# Patient Record
Sex: Female | Born: 1950
Health system: Southern US, Community
[De-identification: ages and names within clinical notes are randomized; demographics above are authoritative.]

## PROBLEM LIST (undated history)

## (undated) DIAGNOSIS — M810 Age-related osteoporosis without current pathological fracture: Secondary | ICD-10-CM

## (undated) DIAGNOSIS — M1990 Unspecified osteoarthritis, unspecified site: Secondary | ICD-10-CM

## (undated) DIAGNOSIS — G119 Hereditary ataxia, unspecified: Secondary | ICD-10-CM

## (undated) DIAGNOSIS — T7840XA Allergy, unspecified, initial encounter: Secondary | ICD-10-CM

## (undated) HISTORY — DX: Unspecified osteoarthritis, unspecified site: M19.90

## (undated) HISTORY — DX: Hereditary ataxia, unspecified: G11.9

## (undated) HISTORY — DX: Age-related osteoporosis without current pathological fracture: M81.0

## (undated) HISTORY — PX: ABDOMINAL HYSTERECTOMY: SHX81

## (undated) HISTORY — DX: Allergy, unspecified, initial encounter: T78.40XA

---

## 2006-03-05 ENCOUNTER — Encounter (INDEPENDENT_AMBULATORY_CARE_PROVIDER_SITE_OTHER): Payer: Self-pay | Admitting: *Deleted

## 2006-03-05 ENCOUNTER — Ambulatory Visit (HOSPITAL_COMMUNITY): Admission: RE | Admit: 2006-03-05 | Discharge: 2006-03-05 | Payer: Self-pay | Admitting: Gastroenterology

## 2010-09-27 ENCOUNTER — Encounter: Payer: Self-pay | Admitting: Sports Medicine

## 2010-09-29 ENCOUNTER — Ambulatory Visit
Admission: RE | Admit: 2010-09-29 | Discharge: 2010-09-29 | Payer: Self-pay | Source: Home / Self Care | Attending: Sports Medicine | Admitting: Sports Medicine

## 2010-09-29 DIAGNOSIS — R269 Unspecified abnormalities of gait and mobility: Secondary | ICD-10-CM | POA: Insufficient documentation

## 2010-09-29 DIAGNOSIS — M79609 Pain in unspecified limb: Secondary | ICD-10-CM | POA: Insufficient documentation

## 2010-10-20 NOTE — Assessment & Plan Note (Signed)
Summary: NP,CHRONIC FOOT PAIN,[facility]   Vital Signs:  Patient profile:   60 year old female Height:      63 inches Weight:      160 pounds BMI:     28.45 Pulse rate:   89 / minute BP sitting:   129 / 81  (left arm)  Vitals Entered By: Rochele Pages RN (September 29, 2010 8:54 AM) CC: bilat foot pain, L arch, R metatarsals   CC:  bilat foot pain, L arch, and R metatarsals.  History of Present Illness: Pt presents to clinic for evaluation of bilateral foot pain x 6 months.  L foot pain is in arch, and R foot pain is across metatarsals.  No pain in heels.  Pain is intermittent aching, sometimes with walking, and other times at rest.  Worsens thru the day.  She has taken ibuprofen 200mg  prn for pain, which is slightly helpful - does not use every day.  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   Denies any specific injury or trauma.  No change in activity.  No change in footwear, but was wearing flat shoes often.  Wears crocs around the house which are helpful.  Denies numbness/tingling.  Denies swelling.  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  Eyes:  PERRL Lungs:  normal respiratory effort.   Msk:  FEET: - L foot: good active and passive ROM, mild TTP over medial navicular, hyper-pigmentation over dorsum of toes, great toe overlaps 2nd toe, morton's callus, pes planus.  pronation with standing and quick pronation with walking - R foot: good active and passive ROM, mild TTP over 2nd, 3rd, 4th MTP joints.  Hyper-pigmentation over dorsum of toes, 2nd toe overlaps great toe, morton's callus, pes planus.  TTP inter-digital space between 2nd/3rd & 3rd/4th toes.  pronation with standing  ANKLES: FROM bilaterally without pain, weakness, tenderness, or instability.  GAIT: no leg length difference.  Mlld genu valgum.  significant wobble of ankles b/l with walking - noted to have rapid pronation with walking.    HIPS: mild weakness of abductors & adductors b/l.  FROM without pain. Pulses:  +2/4 DP & PT b/l Neurologic:  sensation intact to light touch.     Impression & Recommendations:  Problem # 1:  FOOT PAIN, BILATERAL (ICD-729.5)  - Chronic foot pain over left longitudinal arch and in right metatarsals due to breakdown of arches and insufficient support in shoes. - Gave sports insoles with added small scaphoid pads & metatarsal pads.  Comfortable to patient in office and gait more neutral with decreased rapid pronation. - Recommended wearing supportive shoes with good arch support & cushioning. - May take ibuprofen  600-800mg  three times a day with food as needed pain  - Instructed patient to return to clinic in 4-6 weeks if no improvement or as needed.  Orders: Sports Insoles [phone]) Metatarsal Pads [phone])  Problem # 2:  ABNORMALITY OF GAIT (ICD-781.2) - b/l pes planus with increased pronation & rapid pronation with walking - fitted with sports insoles as stated above.  Gait more neutral, rapid pronation still present, but decreased.  Orders: Sports  Insoles [phone]) Metatarsal Pads [phone])   Orders Added: 1)  New Patient Level II [99202] 2)  Sports Insoles [L3510] 3)  Metatarsal Pads [K4401]

## 2010-10-20 NOTE — Consult Note (Signed)
Summary: [facility]  [facility]   Imported By: Marily Memos 09/28/2010 09:34:14  _____________________________________________________________________  External Attachment:    Type:   Image     Comment:   External Document

## 2011-02-03 NOTE — Op Note (Signed)
NAMECANDE, Priscilla Washington               ACCOUNT NO.:  [id number]   MEDICAL RECORD NO.:  [id number]          PATIENT TYPE:  AMB   LOCATION:  ENDO                         FACILITY:  [facility]   PHYSICIAN:  Anselmo Rod, M.D.  DATE OF BIRTH:  [birth year]   DATE OF PROCEDURE:  03/07/2006  DATE OF DISCHARGE:  03/05/2006                                 OPERATIVE REPORT   PROCEDURE PERFORMED:  Colonoscopy with snare polypectomy x2 and cold  biopsies x4.   ENDOSCOPIST:  Anselmo Rod, M.D.   INSTRUMENT USED:  Olympus video colonoscope.   INDICATIONS FOR PROCEDURE:  A 60 year old female undergoing screening  colonoscopy to rule out colonic polyps, masses, etc.  There is a family  history of colon cancer in a first degree relative (mother).   PREPROCEDURE PREPARATION:  Informed consent was procured from the patient.  The patient was fasted for four hours prior to the procedure and prepped  with OsmoPrep pills the night of and the morning of the procedure.  The  risks and benefits of the procedure including a 10% miss rate for cancer or  polyps was discussed with the patient as well.   PREPROCEDURE PHYSICAL:  The patient had stable vital signs.  Neck supple.  Chest clear to auscultation.  S1 and S2 regular.  Abdomen soft with normal  bowel sounds.   DESCRIPTION OF PROCEDURE:  The patient was placed in left lateral decubitus  position and sedated with 75 mcg of fentanyl and 5 mg of Versed in slow  incremental doses.  Once the patient was adequately sedated and maintained  on low flow oxygen and continuous cardiac monitoring, the Olympus video  colonoscope was advanced from the rectum to the cecum.  The appendicular  orifice and ileocecal valve were clearly visualized and photographed.  Two  polyps were snared from the proximal right colon and distal right colon  respectively.  These were removed by a hot snare.  Four small sessile polyps  were biopsied from the rectosigmoid colon.  The rest of  the exam was  unremarkable.  There was no evidence of diverticulosis.  Retroflexion in the  rectum revealed no abnormality.  The patient tolerated the procedure well  without complication.   IMPRESSION:  1.Two polyps removed by hot snare, one from the proximal right  colon, one from the distal right colon.  2.Four small sessile polyps biopsied from rectosigmoid colon.  3.No other masses or polyps seen.   RECOMMENDATIONS:  1.Await pathology results.  2.Avoid all nonsteroidals including aspirin for the next two weeks.  3.Repeat colonoscopy depending on pathology results.  4.Outpatient followup as need arises in the future.      Anselmo Rod, M.D.  Electronically Signed     JNM/MEDQ  D:  03/07/2006  T:  03/07/2006  Job:  [id number]   cc:   Arlyce Harman  Fax: [phone]

## 2013-02-24 ENCOUNTER — Encounter: Payer: Self-pay | Admitting: Neurology

## 2013-02-25 ENCOUNTER — Ambulatory Visit (INDEPENDENT_AMBULATORY_CARE_PROVIDER_SITE_OTHER): Payer: BC Managed Care – PPO | Admitting: Neurology

## 2013-02-25 ENCOUNTER — Encounter: Payer: Self-pay | Admitting: Neurology

## 2013-02-25 VITALS — BP 141/83 | HR 94 | Ht 62.75 in | Wt 150.0 lb

## 2013-02-25 DIAGNOSIS — R269 Unspecified abnormalities of gait and mobility: Secondary | ICD-10-CM

## 2013-02-25 DIAGNOSIS — M79609 Pain in unspecified limb: Secondary | ICD-10-CM

## 2013-02-25 NOTE — Progress Notes (Signed)
HPI: Priscilla Washington is a 62 years old right-handed Caucasian female, referred by her primary care physician Dr. Loyal Jacobson, orthopedic surgeon and Hadley Pen, accompanied by her daughter for evaluation of progressive gait difficulty   She has PMHx of HTN, strong family history of gait difficulty. Her brother at 4, had gait difficult, started at age 48, was diagnosed with cerebellar degenerative disease, progressive quickly to wheel chair bound now, reported abnormal MRI brain, but no genetic testings, Her nephew at age 54, from old sister had similar gait difficulty, use cane.  The mother of that nephew, her elderly sister, died at age 64, was diagnosed with parkinson's, presented with difficulty walking shaking. All mother died of colon  cancerat age 47, her father died of heart attack at age 60, she has 2 children at age 50, and 42, there was no gait difficulty  She is of Chartered loss adjuster, began to complains of mild gait difficulty for 3 years since 2011,, rapid progression during the first year, now level off over the past 2 years, she complains of unsteady gait, especially unfamiliar environment, has difficulty keeping her balance,  She denies visual loss, no bilateral lower extremity weakness, no incontinence, no low back pain, no significant upper extremity symptoms, no chewing swallowing difficulty, no dysarthria  Review of Systems  Out of a complete 14 system review, the patient complains of only the following symptoms, and all other reviewed systems are negative.   Constitutional:   N/A Cardiovascular:  N/A Ear/Nose/Throat:  N/A Skin: N/A Eyes: N/A Respiratory: N/A Gastroitestinal: N/A    Hematology/Lymphatic:  N/A Endocrine:  N/A Musculoskeletal: aching muscles, balance issue Allergy/Immunology: N/A Neurological: N/A Psychiatric:    N/A  PHYSICAL EXAMINATOINS:  Generalized: In no acute distress  Neck: Supple, no carotid bruits   Cardiac: Regular rate rhythm  Pulmonary: Clear to  auscultation bilaterally  Musculoskeletal: No deformity  Neurological examination  Mentation: Alert oriented to time, place, history taking, and causual conversation  Cranial nerve II-XII: Pupils were equal round reactive to light extraocular movements were full, visual field were full on confrontational test. facial sensation and strength were normal. Smooth pursuit was broken into small catch up saccade.  Hearing was intact to finger rubbing bilaterally. Uvula tongue midline.  head turning and shoulder shrug and were normal and symmetric.Tongue protrusion into cheek strength was normal.  Motor: normal tone, bulk and strength.  Sensory: Intact to fine touch, pinprick, preserved vibratory sensation, and proprioception at toes.  Coordination: Normal finger to nose, heel-to-shin dysmetria  Bilaterally, there was  mild  truncal ataxia  Gait: wide based ataxic gait, could not do tandem walking. Mild trunk ataxia  Romberg signs: positive.  Deep tendon reflexes: Brachioradialis 2/2, biceps 2/2, triceps 2/2, patellar 2/2, Achilles 2/2, plantar responses were flexor bilaterally.  Assessment and plan:  62 years old right-handed Caucasian female, with past medical history of gradual onset gait difficulty, family history of similar disorder,  on examination, she has limb ataxia, ataxic gait,   1. Consistent with autosome dominant spinocerebellar ataxia. 2. MRI brain, EMG/NCS. 3. Physical therapy. 4. Lab evaluation. 5. RTC in 1-2 months

## 2013-02-26 LAB — COMPREHENSIVE METABOLIC PANEL
AST: 27 IU/L (ref 0–40)
Alkaline Phosphatase: 60 IU/L (ref 39–117)
BUN/Creatinine Ratio: 20 (ref 11–26)
CO2: 30 mmol/L — ABNORMAL HIGH (ref 19–28)
Creatinine, Ser: 0.86 mg/dL (ref 0.57–1.00)
Globulin, Total: 2.3 g/dL (ref 1.5–4.5)
Potassium: 4.3 mmol/L (ref 3.5–5.2)
Sodium: 143 mmol/L (ref 134–144)

## 2013-02-26 LAB — CBC
Hemoglobin: 12.8 g/dL (ref 11.1–15.9)
MCH: 27.8 pg (ref 26.6–33.0)
MCV: 85 fL (ref 79–97)
RBC: 4.61 x10E6/uL (ref 3.77–5.28)

## 2013-02-26 LAB — RPR: RPR: NONREACTIVE

## 2013-02-26 LAB — ANA: Anti Nuclear Antibody(ANA): NEGATIVE

## 2013-02-28 ENCOUNTER — Telehealth: Payer: Self-pay | Admitting: Neurology

## 2013-03-03 NOTE — Telephone Encounter (Signed)
Mailing lab results to pt per pt request.

## 2013-03-06 ENCOUNTER — Ambulatory Visit (INDEPENDENT_AMBULATORY_CARE_PROVIDER_SITE_OTHER): Payer: BC Managed Care – PPO

## 2013-03-06 DIAGNOSIS — M79609 Pain in unspecified limb: Secondary | ICD-10-CM

## 2013-03-06 DIAGNOSIS — R269 Unspecified abnormalities of gait and mobility: Secondary | ICD-10-CM

## 2013-03-07 NOTE — Progress Notes (Signed)
Quick Note:  Left a vm on patient's mobile phone and relayed the results of their MRI. The patient was also reminded of any future appointments. Contact information was left if pt has any questions. ______

## 2013-03-07 NOTE — Progress Notes (Signed)
Quick Note:  please call patient, age related changes, no acute lesions, ______

## 2013-04-03 ENCOUNTER — Encounter (INDEPENDENT_AMBULATORY_CARE_PROVIDER_SITE_OTHER): Payer: BC Managed Care – PPO

## 2013-04-03 ENCOUNTER — Ambulatory Visit (INDEPENDENT_AMBULATORY_CARE_PROVIDER_SITE_OTHER): Payer: BC Managed Care – PPO | Admitting: Neurology

## 2013-04-03 DIAGNOSIS — R269 Unspecified abnormalities of gait and mobility: Secondary | ICD-10-CM

## 2013-04-03 DIAGNOSIS — M79609 Pain in unspecified limb: Secondary | ICD-10-CM

## 2013-04-03 DIAGNOSIS — Z0289 Encounter for other administrative examinations: Secondary | ICD-10-CM

## 2013-04-03 NOTE — Procedures (Signed)
History of present illness:  62 years old [place] American female, with gradual onset gait difficulty, strong family history of similar disease,   Nerve conduction study:  Bilateral peroneal sensory responses are absent. Bilateral peroneal to EDB, and tibial motor responses were absent. Bilateral tibial H. reflexes were present and symmetric   Electromyography:  Selected needle examination was performed at right lower extremity muscles, and right lumbosacral paraspinal muscles .  Needle examination of right tibialis anterior, tibialis posterior, medial gastrocnemius, peroneal longus, vastus lateralis was normal . There was no spontaneous activity at right lumbosacral paraspinal muscles, right L4, L5, S1   In conclusion:   This is a normal study. There is no electrodiagnostic evidence of large fiber peripheral neuropathy, or right lumbosacral radiculopathy .

## 2013-04-08 ENCOUNTER — Telehealth: Payer: Self-pay | Admitting: Neurology

## 2013-04-08 NOTE — Telephone Encounter (Signed)
Patient left message wanting to know if the doctor wants her to continue with or to have PT, message was not

## 2013-04-14 NOTE — Telephone Encounter (Signed)
I spoke to patient and told her that her referral was sent for PT to [facility].  She was advised to call them and check on an appointment.  She agreed.

## 2017-09-27 DIAGNOSIS — Z1211 Encounter for screening for malignant neoplasm of colon: Secondary | ICD-10-CM | POA: Diagnosis not present

## 2017-09-27 DIAGNOSIS — R635 Abnormal weight gain: Secondary | ICD-10-CM | POA: Diagnosis not present

## 2017-09-27 DIAGNOSIS — Z800 Family history of malignant neoplasm of digestive organs: Secondary | ICD-10-CM | POA: Diagnosis not present

## 2017-10-24 DIAGNOSIS — Z1231 Encounter for screening mammogram for malignant neoplasm of breast: Secondary | ICD-10-CM | POA: Diagnosis not present

## 2017-10-24 DIAGNOSIS — Z1212 Encounter for screening for malignant neoplasm of rectum: Secondary | ICD-10-CM | POA: Diagnosis not present

## 2017-10-24 DIAGNOSIS — Z1289 Encounter for screening for malignant neoplasm of other sites: Secondary | ICD-10-CM | POA: Diagnosis not present

## 2017-11-16 DIAGNOSIS — K635 Polyp of colon: Secondary | ICD-10-CM | POA: Diagnosis not present

## 2017-11-16 DIAGNOSIS — Z800 Family history of malignant neoplasm of digestive organs: Secondary | ICD-10-CM | POA: Diagnosis not present

## 2017-11-16 DIAGNOSIS — D127 Benign neoplasm of rectosigmoid junction: Secondary | ICD-10-CM | POA: Diagnosis not present

## 2017-11-16 DIAGNOSIS — Z1211 Encounter for screening for malignant neoplasm of colon: Secondary | ICD-10-CM | POA: Diagnosis not present

## 2018-10-30 DIAGNOSIS — Z1231 Encounter for screening mammogram for malignant neoplasm of breast: Secondary | ICD-10-CM | POA: Diagnosis not present

## 2018-10-30 DIAGNOSIS — G250 Essential tremor: Secondary | ICD-10-CM | POA: Diagnosis not present

## 2018-10-30 DIAGNOSIS — Z0000 Encounter for general adult medical examination without abnormal findings: Secondary | ICD-10-CM | POA: Diagnosis not present

## 2018-10-30 DIAGNOSIS — R2681 Unsteadiness on feet: Secondary | ICD-10-CM | POA: Diagnosis not present

## 2018-10-30 DIAGNOSIS — H6122 Impacted cerumen, left ear: Secondary | ICD-10-CM | POA: Diagnosis not present

## 2018-10-30 DIAGNOSIS — E041 Nontoxic single thyroid nodule: Secondary | ICD-10-CM | POA: Diagnosis not present

## 2018-11-05 DIAGNOSIS — G119 Hereditary ataxia, unspecified: Secondary | ICD-10-CM | POA: Insufficient documentation

## 2018-11-12 DIAGNOSIS — Z1231 Encounter for screening mammogram for malignant neoplasm of breast: Secondary | ICD-10-CM | POA: Diagnosis not present

## 2019-11-10 ENCOUNTER — Ambulatory Visit: Payer: Medicare HMO | Attending: Family

## 2019-11-10 DIAGNOSIS — Z23 Encounter for immunization: Secondary | ICD-10-CM | POA: Insufficient documentation

## 2019-11-10 NOTE — Progress Notes (Signed)
   Covid-19 Vaccination Clinic  Name:  Priscilla Washington    MRN: [id number] DOB: Jul 10, 1951  11/10/2019  [name] was observed post Covid-19 immunization for 15 minutes without incidence. She was provided with Vaccine Information Sheet and instruction to access the V-Safe system.   [name] was instructed to call 911 with any severe reactions post vaccine: Marland Kitchen Difficulty breathing  . Swelling of your face and throat  . A fast heartbeat  . A bad rash all over your body  . Dizziness and weakness    Immunizations Administered    Name Date Dose VIS Date Route   Moderna COVID-19 Vaccine 11/10/2019  2:08 PM 0.5 mL 08/19/2019 Intramuscular   Manufacturer: Moderna   Lot: [id number]   [id number]

## 2019-12-16 ENCOUNTER — Ambulatory Visit: Payer: Medicare HMO | Attending: Family

## 2019-12-16 DIAGNOSIS — Z23 Encounter for immunization: Secondary | ICD-10-CM

## 2019-12-16 NOTE — Progress Notes (Signed)
   Covid-19 Vaccination Clinic  Name:  Robynn Dubach    MRN: [id number] DOB: Feb 11, 1951  12/16/2019  Ms. Kluth was observed post Covid-19 immunization for 15 minutes without incident. She was provided with Vaccine Information Sheet and instruction to access the V-Safe system.   Ms. Tellefsen was instructed to call 911 with any severe reactions post vaccine: Marland Kitchen Difficulty breathing  . Swelling of face and throat  . A fast heartbeat  . A bad rash all over body  . Dizziness and weakness   Immunizations Administered    Name Date Dose VIS Date Route   Moderna COVID-19 Vaccine 12/16/2019  1:36 PM 0.5 mL 08/19/2019 Intramuscular   Manufacturer: Moderna   Lot: [id number]   [place]indaDW:[id number]

## 2020-02-13 LAB — CBC AND DIFFERENTIAL
HCT: 41 (ref 36–46)
Hemoglobin: 12.6 (ref 12.0–16.0)
Neutrophils Absolute: 2
Platelets: 280 (ref 150–399)
WBC: 4

## 2020-02-13 LAB — BASIC METABOLIC PANEL
BUN: 20 (ref 4–21)
CO2: 30 — AB (ref 13–22)
Chloride: 106 (ref 99–108)
Creatinine: 0.7 (ref 0.5–1.1)
Glucose: 81
Potassium: 4.3 (ref 3.4–5.3)
Sodium: 141 (ref 137–147)

## 2020-02-13 LAB — HEPATIC FUNCTION PANEL
ALT: 33 (ref 7–35)
AST: 28 (ref 13–35)
Alkaline Phosphatase: 61 (ref 25–125)
Bilirubin, Total: 0.4

## 2020-02-13 LAB — LIPID PANEL
Cholesterol: 252 — AB (ref 0–200)
HDL: 93 — AB (ref 35–70)
LDL Cholesterol: 151
Triglycerides: 42 (ref 40–160)

## 2020-02-13 LAB — CBC: RBC: 4.6 (ref 3.87–5.11)

## 2020-02-13 LAB — COMPREHENSIVE METABOLIC PANEL
Albumin: 3.9 (ref 3.5–5.0)
Calcium: 9.5 (ref 8.7–10.7)

## 2020-02-13 LAB — TSH: TSH: 0.88 (ref 0.41–5.90)

## 2020-02-13 LAB — VITAMIN D 25 HYDROXY (VIT D DEFICIENCY, FRACTURES): Vit D, 25-Hydroxy: 53

## 2020-04-14 ENCOUNTER — Ambulatory Visit: Payer: Medicare HMO | Admitting: Physician Assistant

## 2020-04-19 ENCOUNTER — Encounter: Payer: Self-pay | Admitting: Physician Assistant

## 2020-04-19 ENCOUNTER — Other Ambulatory Visit: Payer: Self-pay

## 2020-04-19 ENCOUNTER — Ambulatory Visit (INDEPENDENT_AMBULATORY_CARE_PROVIDER_SITE_OTHER): Payer: Medicare HMO | Admitting: Physician Assistant

## 2020-04-19 VITALS — BP 140/80 | HR 67 | Temp 98.2°F | Resp 16 | Ht 62.5 in | Wt 136.0 lb

## 2020-04-19 DIAGNOSIS — G119 Hereditary ataxia, unspecified: Secondary | ICD-10-CM

## 2020-04-19 DIAGNOSIS — R2681 Unsteadiness on feet: Secondary | ICD-10-CM

## 2020-04-19 NOTE — Patient Instructions (Addendum)
   I will work on getting you a new rollator walker.  Start a B complex multivitamin and Citracal-D supplement.   We will get records to see about recent diagnosis of osteoporosis as this should be treated. I will get records of recent lab results as well. I will call once I have reviewed.   Follow-up with GYN as scheduled.  You will be scheduled for an appointment with Sports Medicine and Neurology.   It was very nice meeting you today. Welcome to [facility]!

## 2020-04-19 NOTE — Progress Notes (Signed)
Patient presents to clinic today with her daughter to establish care. Previously followed by PCP in [place], [place]. Has also been followed by GYN, Dr. Enid Skeens. Notes Mammogram up-to-date. Also noted Bone Density up-to-date. Records requested.   Their biggest concern today is worsening issue with balance and gait. This has been an issue over the past few years, causing patient to require assistance with mobility. Initially needed a cane occasionally, then frequently, then a regular walker, now a rollator walker. Thankfully no recent falls. Patient states she cannot maintain her balance easily. Denies dizziness or lightheadedness. They have never seen a Neurologist. Daughter notes that patient's older sister, Lib, has Parkinson's disease. All of her brothers have been diagnosed with cerebellar ataxia. Notes patient had MRI about 10 years ago and was told things looked normal at that time. They are not noting any major issues with memory or changes in mood. Of note patient is in need of new rollator walker as hers is about 48+ years old and the brakes are not working well.    History reviewed. No pertinent past medical history.  Past Surgical History:  Procedure Laterality Date   ABDOMINAL HYSTERECTOMY Bilateral 1999,2000    Current Outpatient Medications on File Prior to Visit  Medication Sig Dispense Refill   AMOXICILLIN PO Take by mouth as directed.     aspirin 81 MG tablet Take 81 mg by mouth daily.     CALCIUM ASCORBATE PO Take by mouth daily.     diazepam (VALIUM) 2 MG tablet Take 2 mg by mouth 3 (three) times daily.     fluticasone (FLONASE) 50 MCG/ACT nasal spray Place 2 sprays into the nose daily.     ipratropium (ATROVENT) 0.03 % nasal spray Place 0.03 sprays into the nose daily.     Magnesium 250 MG TABS Take 250 mg by mouth daily.     simvastatin (ZOCOR) 20 MG tablet Take 20 mg by mouth daily.     No current facility-administered medications on file prior to visit.     Allergies  Allergen Reactions   Demerol [Meperidine]    Meperidine Hcl     Family History  Problem Relation Age of Onset   Colon cancer Mother    Heart attack Father    Colon cancer Other     Social History   Socioeconomic History   Marital status: Divorced    Spouse name: Not on file   Number of children: 2   Years of education: MBA   Highest education level: Not on file  Occupational History   Occupation: school teacher    Employer: [employer]  Tobacco Use   Smoking status: Never Smoker   Smokeless tobacco: Never Used  Substance and Sexual Activity   Alcohol use: No   Drug use: No   Sexual activity: Not on file  Other Topics Concern   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 She lives by herself. She denies smoking, drinking.   Social Determinants of Health   Financial Resource Strain:    Difficulty of Paying Living Expenses:   Food Insecurity:    Worried About Charity fundraiser in the Last Year:    Arboriculturist in the Last Year:   Transportation Needs:    Film/video editor (Medical):    Lack of Transportation (Non-Medical):   Physical Activity:    Days of  Exercise per Week:    Minutes of Exercise per Session:   Stress:    Feeling of Stress :   Social Connections:    Frequency of Communication with Friends and Family:    Frequency of Social Gatherings with Friends and Family:    Attends Religious Services:    Active Member of Clubs or Organizations:    Attends Music therapist:    Marital Status:   Intimate Partner Violence:    Fear of Current or Ex-Partner:    Emotionally Abused:    Physically Abused:    Sexually Abused:    ROS Pertinent ROS are listed in the HPI.   Ht 5' 2.5" (1.588 m)    Wt 136 lb (61.7 kg)    BMI 24.48 kg/m   Physical Exam Vitals reviewed.   Constitutional:      Appearance: Normal appearance.  HENT:     Head: Normocephalic and atraumatic.  Eyes:     Conjunctiva/sclera: Conjunctivae normal.     Pupils: Pupils are equal, round, and reactive to light.  Cardiovascular:     Rate and Rhythm: Normal rate and regular rhythm.     Pulses: Normal pulses.     Heart sounds: Normal heart sounds.  Pulmonary:     Effort: Pulmonary effort is normal.     Breath sounds: Normal breath sounds.  Musculoskeletal:     Cervical back: Normal range of motion and neck supple.  Neurological:     General: No focal deficit present.     Mental Status: She is alert and oriented to person, place, and time. Mental status is at baseline.     Cranial Nerves: No cranial nerve deficit.     Sensory: No sensory deficit.     Motor: No weakness.     Gait: Gait abnormal.     Comments: Patient with difficulty on cerebellar testing. Tandem walking not performed due to level of gait instability.  Psychiatric:        Mood and Affect: Mood normal.    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 No cranial nerve deficit. Continue use of rollator walker. New Rx sent in for this. Referral to Neurology placed for further evaluation and management.  - Ambulatory referral to Neurology  2. Gait instability Would like her to have full gait assessment to make sure joint issue and bone length discrepancies are not contributing.  - Ambulatory referral to Sports Medicine  This visit occurred during the SARS-CoV-2 public health emergency.  Safety protocols were in place, including screening questions prior to the visit, additional usage of staff PPE, and extensive cleaning of exam room while observing appropriate contact time as indicated for disinfecting solutions.     Leeanne Rio, PA-C

## 2020-04-21 ENCOUNTER — Encounter: Payer: Self-pay | Admitting: Physician Assistant

## 2020-04-21 ENCOUNTER — Encounter: Payer: Self-pay | Admitting: Neurology

## 2020-04-27 ENCOUNTER — Ambulatory Visit: Payer: Medicare HMO | Admitting: Family Medicine

## 2020-04-27 ENCOUNTER — Encounter: Payer: Self-pay | Admitting: Family Medicine

## 2020-04-27 ENCOUNTER — Other Ambulatory Visit: Payer: Self-pay

## 2020-04-27 DIAGNOSIS — G119 Hereditary ataxia, unspecified: Secondary | ICD-10-CM

## 2020-04-27 DIAGNOSIS — M2170 Unequal limb length (acquired), unspecified site: Secondary | ICD-10-CM

## 2020-04-27 DIAGNOSIS — R269 Unspecified abnormalities of gait and mobility: Secondary | ICD-10-CM | POA: Diagnosis not present

## 2020-04-27 DIAGNOSIS — M25552 Pain in left hip: Secondary | ICD-10-CM

## 2020-04-27 NOTE — Progress Notes (Signed)
Subjective:    CC: Gait instability and balance issues  I, Wendy Poet, LAT, ATC, am serving as scribe for Dr. Lynne Leader.  HPI: Pt is a 69 y/o female presenting w/ c/o worsening gait and balance.  She has transitioned from a cane to a standard walker and most recently to a rollator walker.  She has a family hx of Parkinson's and cerebellar ataxia and has been referred to neurology.    Diagnostic testing: Brain MRI- 03/06/13  Pertinent review of Systems: No fevers or chills  Relevant historical information: Family history ataxia   Objective:    General: Well Developed, well nourished, and in no acute distress.   MSK: Leg length discrepancy left leg about 1 cm longer than right. Neuro: Significant ataxic gait using a walker to ambulate. Ataxia with finger-nose-finger and with heel-to-shin upper and lower extremities bilaterally.  Lab and Radiology Results [facility]   NEUROIMAGING REPORT    STUDY DATE: 03/06/13  PATIENT NAME: Priscilla Washington  DOB: 1950-10-23  MRN: [id number]   ORDERING CLINICIAN: Marcial Pacas, MD  CLINICAL HISTORY: 69 year old female with gait difficulty and  ataxia.   EXAM: MRI brain (without)  TECHNIQUE: MRI of the brain without contrast was obtained  utilizing 5 mm axial slices with T1, T2, T2 flair, T2 star  gradient echo and diffusion weighted views. T1 sagittal and T2  coronal views were obtained.  CONTRAST: no  IMAGING SITE: [facility] [address]   FINDINGS:  No abnormal lesions are seen on diffusion-weighted views to  suggest acute ischemia. The cortical sulci, fissures and cisterns  are normal in size and appearance. Lateral, third and fourth  ventricle are normal in size and appearance. No extra-axial fluid  collections are seen. No evidence of mass effect or midline  shift. Minimal periventricular and subcortical foci of  non-specific gliosis.   On sagittal views the posterior fossa, pituitary gland and  corpus  callosum are notable for mild cerebellar atrophy. No evidence of  intracranial hemorrhage on gradient-echo views. The orbits and  their contents, paranasal sinuses and calvarium are unremarkable.  Intracranial flow voids are present.    IMPRESSION:  Equivocal MRI brain (without) demonstrating:  1. Mild cerebellar atrophy.  2. Minimal periventricular and subcortical foci of non-specific  gliosis.     INTERPRETING PHYSICIAN:  Penni Bombard, MD  Certified in Neurology, Neurophysiology and Neuroimaging   [facility]  [address], [place]  [place], [place] [address]  [phone]     Impression and Recommendations:    Assessment and Plan: 69 y.o. female with ataxia.  Patient has pretty clear family history for what seems to be degenerative neurological disorder causing ataxia or motion disorder.  I think it is absolutely reasonable for her to have a new neurological evaluation and likely work-up.  This is scheduled for the near future.  From my perspective she does have a bit of a leg length discrepancy and I try to correct that with a heel lift in her right shoe.  Additionally I think she will benefit significantly from physical therapy is independently of her neurological issue will probably help some of the muscle imbalances and weaknesses that she has as a result of her leg length difference and her gait disorder.  Plan to refer to physical therapy to neuro rehab.  Anticipate neurology recommendations and recheck in 6 to 8 weeks.   Orders Placed This Encounter  Procedures  . Ambulatory referral to Physical  Therapy    Referral Priority:   Routine    Referral Type:   Physical Medicine    Referral Reason:   Specialty Services Required    Requested Specialty:   Physical Therapy   No orders of the defined types were placed in this encounter.   Discussed warning signs or symptoms. Please see discharge instructions. Patient expresses  understanding.   The above documentation has been reviewed and is accurate and complete Lynne Leader, M.D.

## 2020-04-27 NOTE — Patient Instructions (Signed)
Thank you for coming in today.  Plan for heel lift in the right shoe.  Plan for PT with neuro rehab.  Agree with Neurology referral.  Recheck in 6-8 weeks with me.

## 2020-04-27 NOTE — Progress Notes (Signed)
Assessment/Plan:    1.  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  F/u after above completed.  Subjective:   Priscilla Washington was seen today in the movement disorders clinic for neurologic consultation at the request of Delorse Limber.  The consultation is for the evaluation of ataxia. This patient is accompanied in the office by her daughter who supplements the history. Outside records that were made available to me were reviewed, including records from primary care as well as records from Dr. Krista Blue back in 2014.  She saw Dr. Krista Blue for the same.  Dr. Krista Blue did write in her records that the patient's history/exam was consistent with autosomal dominant spinocerebellar ataxia and that she was going to do a lab evaluation.  While I see a phone call stating that labs were normal, I do not see anything scanned in the find out exactly what was tested for.  Pt does not think that genetic testing was completed.  [facility] was called by [place] and they have never done labs for this patient.  Symptoms started with gait trouble in the year 2011 per medical records from Dr. Krista Blue.  She saw Dr. Krista Blue in 2014.  She denies falls but the balance has gotten worse with the course of time.  She started using walker intermittently in 2011 but over the course of time, she has needed to hold onto things.  Now, she uses the walker all of the time.     Specific Symptoms:  Tremor: Yes.  , if gets nervous - she will shake on the inside and daughter notices some head tremor Family hx of similar:  Yes.   , Older sister had Parkinson's disease but daughter wonders if misdx.    Brothers symptoms started at the age of 77 years old.  Apparently had had no genetic testing but had clinical dx of SCA.  He was wheelchair-bound but is deceased now.  Her older sisters son (her nephew) has gait trouble, but unknown diagnosis.  Has another nephew with similar gait trouble. Voice: patient states "fine" but daughter states occasionally slurred Loss of smell:  No. Loss of taste:  No. Urinary Incontinence:  No. Difficulty Swallowing:  No. Handwriting, micrographia: No. but it is shaky Cramps: pt denies initially but daughter states that her hands cramp more frequently than other Trouble with ADL's:  No.  Trouble buttoning clothing: No. Depression:  No. Memory changes:  No. N/V:  No. Lightheaded:  No.  Syncope: No. Diplopia:  Yes.   - "sometimes I have to close one eye to focus" Paresthesias:  Yes.  , feet, esp at night, better in the day   Patient's last MRI of the brain was in June, 2014 through [facility] neurology.  I am unable to see the films.  It states that there is mild cerebellar atrophy and small vessel disease.   ALLERGIES:   Allergies  Allergen Reactions  . Demerol [Meperidine]   . Meperidine Hcl     CURRENT MEDICATIONS:  Current Outpatient Medications  Medication Instructions  .  aspirin 81 mg, Oral, Daily  . calcium citrate-vitamin D (CITRACAL+D) 315-200 MG-UNIT tablet 1 tablet, Oral, Daily  . Multiple Vitamin (MULTIVITAMIN) tablet 1 tablet, Oral, Daily    Objective:   VITALS:   Vitals:   04/29/20 1335  BP: (!) 162/82  Pulse: 80  SpO2: 100%  Weight: 133 lb (60.3 kg)  Height: 5' 3.5" (1.613 m)    GEN:  The patient appears stated age and is in NAD. HEENT:  Normocephalic, atraumatic.  The mucous membranes are moist. The superficial temporal arteries are without ropiness or tenderness. CV:  RRR Lungs:  CTAB Neck/HEME:  There are no carotid bruits bilaterally.  Slight  head/neck tremor.  Head turned to the left.    Neurological examination:  Orientation: The patient is alert and oriented x3.  Cranial nerves: There is good facial symmetry. Extraocular muscles are intact with the exception of upgaze paresis. The visual fields are full to confrontational testing. The speech is fluent and clear with mild pseudobulbar character. Soft palate rises symmetrically and there is no tongue deviation. Hearing is intact to conversational tone. Sensation: Sensation is intact to light and pinprick throughout (facial, trunk, extremities). Vibration is intact at the bilateral big toe. There is no extinction with double simultaneous stimulation. There is no sensory dermatomal level identified. Motor: Strength is 5/5 in the bilateral upper and lower extremities.   Shoulder shrug is equal and symmetric.  There is no pronator drift. Deep tendon reflexes: Deep tendon reflexes are 2+-3/4 at the bilateral biceps, triceps, brachioradialis, patella and achilles. Plantar responses are downgoing bilaterally.  Movement examination: Tone: There is normal tone in the bilateral upper extremities.  The tone in the lower extremities is normal.  Abnormal movements: there is head tremor in the "no" direction as above Coordination:  There is no decremation with RAM's.  She is ataxic with H-S Gait and Station: The patient has difficulty arising out of a deep-seated chair without the use of the hands. The patient is ataxic and would fall without assist.  She does fairly well with the walker.   I have reviewed and interpreted the following labs independently   Chemistry      Component Value Date/Time   NA 141 02/13/2020 0000   K 4.3 02/13/2020 0000   CL 106 02/13/2020 0000   CO2 30 (A) 02/13/2020 0000   BUN 20 02/13/2020 0000   CREATININE 0.7 02/13/2020 0000   CREATININE 0.86 02/25/2013 1142   GLU 81 02/13/2020 0000      Component Value Date/Time   CALCIUM 9.5 02/13/2020 0000   ALKPHOS 61  02/13/2020 0000   AST 28 02/13/2020 0000   ALT 33 02/13/2020 0000   BILITOT 0.3 02/25/2013 1142      Lab Results  Component Value Date   TSH 0.88 02/13/2020   Lab Results  Component Value Date   WBC 4.0 02/13/2020   HGB 12.6 02/13/2020   HCT 41 02/13/2020   MCV 85 02/25/2013   PLT 280 02/13/2020   Lab Results  Component Value Date   VITAMINB12 1,652 (H) 02/25/2013     Total time spent on today's visit was 70 minutes, including both face-to-face time and nonface-to-face time.  Time included that spent on review of records (prior notes available to me/labs/imaging if pertinent), discussing treatment and goals, answering patient's questions and coordinating care.  Cc:  Brunetta Jeans, PA-C

## 2020-04-29 ENCOUNTER — Encounter: Payer: Self-pay | Admitting: Neurology

## 2020-04-29 ENCOUNTER — Ambulatory Visit: Payer: Medicare HMO | Admitting: Neurology

## 2020-04-29 ENCOUNTER — Other Ambulatory Visit: Payer: Self-pay

## 2020-04-29 VITALS — BP 162/82 | HR 80 | Ht 63.5 in | Wt 133.0 lb

## 2020-04-29 DIAGNOSIS — R270 Ataxia, unspecified: Secondary | ICD-10-CM

## 2020-04-29 NOTE — Addendum Note (Signed)
Addended by: Ulice Brilliant T on: 04/29/2020 02:40 PM   Modules accepted: Orders

## 2020-04-29 NOTE — Patient Instructions (Signed)
A referral to [facility] has been placed for your MRI someone will contact you directly to schedule your appt. They are located at [address] Please contact them directly by calling [phone] with any questions regarding your referral.  

## 2020-05-03 ENCOUNTER — Telehealth: Payer: Self-pay | Admitting: Physician Assistant

## 2020-05-03 NOTE — Telephone Encounter (Signed)
Spoke with Patrick Jupiter at [facility] who said they did not receive the faxed order for the Rollator on 04/23/20. I re-faxed today to the correct fax number. Advised patient daughter of status of rollator. If she has heard from them to let us know. She is agreeable.

## 2020-05-03 NOTE — Telephone Encounter (Signed)
Pt's daughter called in asking for an up date on the new rollator walker.  Please advise

## 2020-05-08 ENCOUNTER — Ambulatory Visit (HOSPITAL_BASED_OUTPATIENT_CLINIC_OR_DEPARTMENT_OTHER)
Admission: RE | Admit: 2020-05-08 | Discharge: 2020-05-08 | Disposition: A | Discharge status: Home / Self Care | Payer: Medicare HMO | Source: Ambulatory Visit | Attending: Neurology | Admitting: Neurology

## 2020-05-08 ENCOUNTER — Other Ambulatory Visit: Payer: Self-pay

## 2020-05-08 DIAGNOSIS — R270 Ataxia, unspecified: Secondary | ICD-10-CM | POA: Diagnosis present

## 2020-05-13 ENCOUNTER — Telehealth: Payer: Self-pay | Admitting: Neurology

## 2020-05-13 DIAGNOSIS — R270 Ataxia, unspecified: Secondary | ICD-10-CM

## 2020-05-13 NOTE — Telephone Encounter (Signed)
Patient called in and left a message. She stated she just got off the phone with someone about her MRI results and had another question.

## 2020-05-13 NOTE — Telephone Encounter (Signed)
Spoke with patient who requested I contact her daughter.

## 2020-05-13 NOTE — Telephone Encounter (Signed)
Patient called back in. She is returning a call from her MRI results.

## 2020-05-14 NOTE — Telephone Encounter (Signed)
Spoke with patients daughter and she wants to know what are the treatment options? Would the treatment be fixed with physical therapy help and if so can we do the referral for physical therapy? Advised daughter that I would speak with Dr Tat and get back with her. She voiced understanding.

## 2020-05-14 NOTE — Telephone Encounter (Signed)
Left message for patients daughter to contact the office.

## 2020-05-14 NOTE — Telephone Encounter (Signed)
This is somewhat of a difficult question to answer since they declined genetic testing and we don't have a definitive diagnosis.  But generally PT is to help symptoms not cure disease

## 2020-05-17 NOTE — Telephone Encounter (Signed)
Pt referral placed for patient to go to [place] home [facility].  Message sent to schedulers to schedule follow up virtually.

## 2020-05-17 NOTE — Telephone Encounter (Signed)
Spoke with patients daughter, gave her Dr Doristine Devoid recommendations. She voiced understanding. She is agreeable with physical therapy and wants a referral.   She would also like to schedule a follow up appt to ask a few questions. She is agreeable with a virtual appt.

## 2020-05-17 NOTE — Telephone Encounter (Signed)
You can go ahead and refer for PT and the front desk can schedule a f/u VV when available

## 2020-06-02 NOTE — Progress Notes (Signed)
  Pt scheduled for VV.  Upon linking only daughter present.  Medical assistant spoke with pt who said that she was unaware of appt.  pts daughter stated that pt was going to cx appt but daughter hoping to ask some questions.  Explained to daughter that I could not do visit without patient present.  She expressed understanding.

## 2020-06-03 ENCOUNTER — Telehealth (INDEPENDENT_AMBULATORY_CARE_PROVIDER_SITE_OTHER): Payer: Medicare HMO | Admitting: Neurology

## 2020-06-03 ENCOUNTER — Other Ambulatory Visit: Payer: Self-pay

## 2020-06-03 ENCOUNTER — Encounter: Payer: Self-pay | Admitting: Neurology

## 2020-06-22 ENCOUNTER — Ambulatory Visit: Payer: Medicare HMO | Admitting: Family Medicine

## 2020-06-22 NOTE — Progress Notes (Deleted)
   I, Wendy Poet, LAT, ATC, am serving as scribe for Dr. Lynne Leader.  Davis Vannatter is a 69 y.o. female who presents to [place] at [facility] today for f/u of gait and balance issues.  She was last seen by Dr. Georgina Snell on 04/27/20 and was referred to neuro rehab and was encouraged to see neurology per her prior referral.  Since her last visit, pt reports   Diagnostic testing: Brain MRI- 05/08/20  Pertinent review of systems: ***  Relevant historical information: ***   Exam:  There were no vitals taken for this visit. General: Well Developed, well nourished, and in no acute distress.   MSK: ***    Lab and Radiology Results No results found for this or any previous visit (from the past 72 hour(s)). No results found.     Assessment and Plan: 69 y.o. female with ***   PDMP not reviewed this encounter. No orders of the defined types were placed in this encounter.  No orders of the defined types were placed in this encounter.    Discussed warning signs or symptoms. Please see discharge instructions. Patient expresses understanding.   ***

## 2020-06-29 ENCOUNTER — Telehealth: Payer: Self-pay | Admitting: Family Medicine

## 2020-06-29 NOTE — Telephone Encounter (Signed)
Pt daughter would like to know if the recheck scheduled for this Friday can be a phone visit?

## 2020-06-30 NOTE — Telephone Encounter (Signed)
Yes. Looks like neurology has taken over the care mostly. Obviously I cannot do a physical exam over the phone but happy to switch to a phone visit.

## 2020-06-30 NOTE — Telephone Encounter (Signed)
Left VM for Tiffany to cb and set up phone visit.

## 2020-07-01 NOTE — Telephone Encounter (Signed)
Spoke with pt daughter, confirmed phone visit 07/02/2020.

## 2020-07-02 ENCOUNTER — Other Ambulatory Visit: Payer: Self-pay

## 2020-07-02 ENCOUNTER — Ambulatory Visit (INDEPENDENT_AMBULATORY_CARE_PROVIDER_SITE_OTHER): Payer: Medicare HMO | Admitting: Family Medicine

## 2020-07-02 DIAGNOSIS — G119 Hereditary ataxia, unspecified: Secondary | ICD-10-CM

## 2020-07-02 NOTE — Progress Notes (Signed)
Virtual Visit  I connected with   Priscilla Washington  by a phone telemedicine application and verified that I am speaking with the correct person using two identifiers.   I discussed the limitations of evaluation and management by telemedicine and the availability of in person appointments. The patient expressed understanding and agreed to proceed.  ? Location of the provider office ? Location of the patient home ? The names and roles of all persons participating in the visit. Priscilla Washington (daughter) and Patient and myself   History of Present Illness: Priscilla Washington is a 69 y.o. female who would like to discuss her balance. I saw Priscilla Washington on August 10 for gait abnormality. At that time there is some concern for cerebellar ataxia. She had a family history of some unknown neurodegenerative ataxia condition. She had pretty rapid follow-up with her neurologist after my visit and had subsequent brain MRI that showed cerebellum, brainstem, and cervical spine atrophy that is consistent with neurodegenerative SCA3 and SCA7. In the interim she has been attending home health physical therapy for balance and strength training which has been helpful. She does not have significant difficulty with hand motion or coordination or cognition. She is pretty happy with how things are going.  Observations/Objective: Exam: Normal Speech.    Lab and Radiology Results MR BRAIN WO CONTRAST  Result Date: 05/08/2020 CLINICAL DATA:  Ataxia.  Progressive tremors.  Balance disturbance. EXAM: MRI HEAD WITHOUT CONTRAST TECHNIQUE: Multiplanar, multiecho pulse sequences of the brain and surrounding structures were obtained without intravenous contrast. COMPARISON:  None. FINDINGS: Brain: Diffusion imaging does not show any acute or subacute infarction. There is bilateral generalized cerebellar atrophy. There is atrophic change of the brainstem and cervical spinal cord. Cerebral hemispheres show only minimal age related volume loss.  Minimal small vessel change of the cerebral hemispheric white matter. No cortical or large vessel territory infarction. No mass lesion, hemorrhage, hydrocephalus or extra-axial collection. Vascular: Major vessels at the base of the brain show flow. Skull and upper cervical spine: Negative Sinuses/Orbits: Clear/normal Other: None IMPRESSION: Atrophy of the cerebellum, brainstem and cervical cord. Consistent with spinocerebellar atrophy. Involvement of all 3 of these regions can be seen with SCA 3 and SCA 7. [email or web address] Electronically Signed   By: Nelson Chimes M.D.   On: 05/08/2020 20:35     Assessment and Plan: 69 y.o. female with ataxia and impaired gait. Very likely due to genetic neurodegenerative disease. Patient has not had genetic testing yet. We discussed the possibility of this. She is pretty happy with how things are going and is not sure that she truly wants a firm diagnosis which is reasonable. Recommend continuing home health physical therapy and home exercise program. Also discussed safety at home. Additionally reviewed her chart. It looks like she had a missed video visit with her neurologist about a month ago. Recommend that she contact her neurologist office again and potentially reschedule it.  Recheck with me as needed.   Follow Up Instructions:    I discussed the assessment and treatment plan with the patient. The patient was provided an opportunity to ask questions and all were answered. The patient agreed with the plan and demonstrated an understanding of the instructions.   The patient was advised to call back or seek an in-person evaluation if the symptoms worsen or if the condition fails to improve as anticipated.  Time: 21 mins. Discussed results of MRI diagnosis and plan and precaution.    Historical information moved to improve visibility  of documentation.  Past Medical History:  Diagnosis Date  . Allergy   . Arthritis   .  Osteoporosis    Past Surgical History:  Procedure Laterality Date  . ABDOMINAL HYSTERECTOMY Bilateral 1999,2000   Social History   Tobacco Use  . Smoking status: Never Smoker  . Smokeless tobacco: Never Used  Substance Use Topics  . Alcohol use: Never   family history includes Colon cancer in her mother and another family member; Diabetes in her sister and sister; Heart attack in her brother and father; Heart disease in her brother; Stroke in her sister.  Medications: Current Outpatient Medications  Medication Sig Dispense Refill  . aspirin 81 MG tablet Take 81 mg by mouth daily.     . B Complex Vitamins (B COMPLEX-B12 PO) Take 1 tablet by mouth daily.    . calcium citrate-vitamin D (CITRACAL+D) 315-200 MG-UNIT tablet Take 1 tablet by mouth daily.    . Multiple Vitamin (MULTIVITAMIN) tablet Take 1 tablet by mouth daily.     No current facility-administered medications for this visit.   Allergies  Allergen Reactions  . Demerol [Meperidine]

## 2020-07-22 ENCOUNTER — Ambulatory Visit: Payer: Medicare HMO | Attending: Internal Medicine

## 2020-07-22 DIAGNOSIS — Z23 Encounter for immunization: Secondary | ICD-10-CM

## 2020-07-22 NOTE — Progress Notes (Signed)
   Covid-19 Vaccination Clinic  Name:  Naoma Boxell    MRN: [id number] DOB: May 20, 1951  07/22/2020  Ms. Degante was observed post Covid-19 immunization for 15 minutes without incident. She was provided with Vaccine Information Sheet and instruction to access the V-Safe system.   Ms. Craker was instructed to call 911 with any severe reactions post vaccine: Marland Kitchen Difficulty breathing  . Swelling of face and throat  . A fast heartbeat  . A bad rash all over body  . Dizziness and weakness

## 2020-10-29 NOTE — Progress Notes (Signed)
Subjective:   Priscilla Washington is a 70 y.o. female who presents for an Initial Medicare Annual Wellness Visit.   I connected with Priscilla Washington  today by telephone and verified that I am speaking with the correct person using two identifiers. Location patient: home Location provider: work Persons participating in the virtual visit: patient, Marine scientist.    I discussed the limitations, risks, security and privacy concerns of performing an evaluation and management service by telephone and the availability of in person appointments. I also discussed with the patient that there may be a patient responsible charge related to this service. The patient expressed understanding and verbally consented to this telephonic visit.    Interactive audio and video telecommunications were attempted between this provider and patient, however failed, due to patient having technical difficulties OR patient did not have access to video capability.  We continued and completed visit with audio only.  Some vital signs may be absent or patient reported.   Time Spent with patient on telephone encounter: 20 minutes   Review of Systems     Cardiac Risk Factors include: advanced age (>35men, >66 women)     Objective:    Today's Vitals   11/01/20 1415  Weight: 133 lb (60.3 kg)  Height: 5' 2.5" (1.588 m)   Body mass index is 23.94 kg/m.  Advanced Directives 11/01/2020 06/03/2020  Does Patient Have a Medical Advance Directive? Yes No  Type of Paramedic of [place];Living will -  Copy of [place] in Chart? No - copy requested -    Current Medications (verified) Outpatient Encounter Medications as of 11/01/2020  Medication Sig  . aspirin 81 MG tablet Take 81 mg by mouth daily.   . B Complex Vitamins (B COMPLEX-B12 PO) Take 1 tablet by mouth daily.  . calcium citrate-vitamin D (CITRACAL+D) 315-200 MG-UNIT tablet Take 1 tablet by mouth daily.  . Multiple Vitamin (MULTIVITAMIN)  tablet Take 1 tablet by mouth daily.   No facility-administered encounter medications on file as of 11/01/2020.    Allergies (verified) Demerol [meperidine]   History: Past Medical History:  Diagnosis Date  . Allergy   . Arthritis   . Osteoporosis    Past Surgical History:  Procedure Laterality Date  . ABDOMINAL HYSTERECTOMY Bilateral 1999,2000   Family History  Problem Relation Age of Onset  . Colon cancer Mother   . Heart attack Father   . Colon cancer Other   . Diabetes Sister   . Heart disease Brother   . Diabetes Sister   . Stroke Sister   . Heart attack Brother    Social History   Socioeconomic History  . Marital status: Divorced    Spouse name: Not on file  . Number of children: 2  . Years of education: MBA  . Highest education level: Not on file  Occupational History  . Occupation: school Product manager: [employer]  Tobacco Use  . Smoking status: Never Smoker  . Smokeless tobacco: Never Used  Vaping Use  . Vaping Use: Never used  Substance and Sexual Activity  . Alcohol use: Never  . Drug use: Never  . Sexual activity: Not Currently  Other Topics Concern  . 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 She lives by herself. She denies smoking, drinking.   Social Determinants of Health   Financial Resource Strain: Low Risk   .  Difficulty of Paying Living Expenses: Not hard at all  Food Insecurity: No Food Insecurity  . Worried About Charity fundraiser in the Last Year: Never true  . Ran Out of Food in the Last Year: Never true  Transportation Needs: No Transportation Needs  . Lack of Transportation (Medical): No  . Lack of Transportation (Non-Medical): No  Physical Activity: Insufficiently Active  . Days of Exercise per Week: 3 days  . Minutes of Exercise per Session: 20 min  Stress: No Stress Concern Present  . Feeling of Stress :  Not at all  Social Connections: Moderately Isolated  . Frequency of Communication with Friends and Family: More than three times a week  . Frequency of Social Gatherings with Friends and Family: More than three times a week  . Attends Religious Services: 1 to 4 times per year  . Active Member of Clubs or Organizations: No  . Attends Archivist Meetings: Never  . Marital Status: Widowed    Tobacco Counseling Counseling given: Not Answered   Clinical Intake:  Pre-visit preparation completed: Yes  Pain : No/denies pain     Nutritional Status: BMI of 19-24  Normal Nutritional Risks: None Diabetes: No  How often do you need to have someone help you when you read instructions, pamphlets, or other written materials from your doctor or pharmacy?: 1 - Never  Diabetic?No  Interpreter Needed?: No  Information entered by :: Caroleen Hamman LPN   Activities of Daily Living In your present state of health, do you have any difficulty performing the following activities: 11/01/2020 04/19/2020  Hearing? N N  Vision? N N  Difficulty concentrating or making decisions? N N  Walking or climbing stairs? N Y  Dressing or bathing? N N  Doing errands, shopping? N Y  Conservation officer, nature and eating ? N -  Using the Toilet? N -  In the past six months, have you accidently leaked urine? N -  Do you have problems with loss of bowel control? N -  Managing your Medications? N -  Managing your Finances? N -  Housekeeping or managing your Housekeeping? N -  Some recent data might be hidden    Patient Care Team: Delorse Limber as PCP - General (Family Medicine) Enid Skeens, Caren Griffins, MD as Referring Physician (Obstetrics and Gynecology)  Indicate any recent Medical Services you may have received from other than [facility] providers in the past year (date may be approximate).     Assessment:   This is a routine wellness examination for [name].  Hearing/Vision screen  Hearing Screening    125Hz  250Hz  500Hz  1000Hz  2000Hz  3000Hz  4000Hz  6000Hz  8000Hz   Right ear:           Left ear:           Comments: No issues  Vision Screening Comments: Wears glasses [facility] exam-06/2020- [facility]  Dietary issues and exercise activities discussed: Current Exercise Habits: Home exercise routine, Type of exercise: strength training/weights (Cardio), Time (Minutes): 30, Frequency (Times/Week): 3, Weekly Exercise (Minutes/Week): 90, Intensity: Mild, Exercise limited by: None identified  Goals    . Patient Stated     Maintain current health & activity level      Depression Screen PHQ 2/9 Scores 11/01/2020 04/19/2020  PHQ - 2 Score 0 0    Fall Risk Fall Risk  11/01/2020 06/03/2020 04/29/2020 04/19/2020  Falls in the past year? 1 0 0 0  Number falls in past yr: 0 0 0 0  Injury with Fall? 0 0 0 0  Risk for fall due to : History of fall(s) - - Impaired balance/gait;Impaired mobility  Follow up Falls prevention discussed - - Falls evaluation completed    FALL RISK PREVENTION PERTAINING TO THE HOME:  Any stairs in or around the home? No  Home free of loose throw rugs in walkways, pet beds, electrical cords, etc? Yes  Adequate lighting in your home to reduce risk of falls? Yes   ASSISTIVE DEVICES UTILIZED TO PREVENT FALLS:  Life alert? No  Use of a cane, walker or w/c? Yes  Grab bars in the bathroom? Yes  Shower chair or bench in shower? No  Elevated toilet seat or a handicapped toilet? No   TIMED UP AND GO:  Was the test performed? No . Phone visit   Cognitive Function:Normal cognitive status assessed by  this Nurse Health Advisor. No abnormalities found.          Immunizations Immunization History  Administered Date(s) Administered  . Moderna SARS-COV2 Booster Vaccination 07/22/2020  . Moderna Sars-Covid-2 Vaccination 11/10/2019, 12/16/2019    TDAP status: Due, Education has been provided regarding the importance of this vaccine. Advised may receive this vaccine at  local pharmacy or Health Dept. Aware to provide a copy of the vaccination record if obtained from local pharmacy or Health Dept. Verbalized acceptance and understanding.  Flu Vaccine status: Declined, Education has been provided regarding the importance of this vaccine but patient still declined. Advised may receive this vaccine at local pharmacy or Health Dept. Aware to provide a copy of the vaccination record if obtained from local pharmacy or Health Dept. Verbalized acceptance and understanding.  Pneumococcal vaccine status: Declined,  Education has been provided regarding the importance of this vaccine but patient still declined. Advised may receive this vaccine at local pharmacy or Health Dept. Aware to provide a copy of the vaccination record if obtained from local pharmacy or Health Dept. Verbalized acceptance and understanding.   Covid-19 vaccine status: Completed vaccines  Qualifies for Shingles Vaccine? Yes   Zostavax completed No   Shingrix Completed?: No.    Education has been provided regarding the importance of this vaccine. Patient has been advised to call insurance company to determine out of pocket expense if they have not yet received this vaccine. Advised may also receive vaccine at local pharmacy or Health Dept. Verbalized acceptance and understanding.  Screening Tests Health Maintenance  Topic Date Due  . Hepatitis C Screening  Never done  . TETANUS/TDAP  Never done  . COLONOSCOPY (Pts 45-49yrs Insurance coverage will need to be confirmed)  Never done  . MAMMOGRAM  Never done  . DEXA SCAN  Never done  . PNA vac Low Risk Adult (1 of 2 - PCV13) Never done  . INFLUENZA VACCINE  Never done  . DTAP VACCINES (1) 05/21/2079 (Originally 10/29/1951)  . COVID-19 Vaccine  Completed  . DTaP/Tdap/Td  Discontinued    Health Maintenance  Health Maintenance Due  Topic Date Due  . Hepatitis C Screening  Never done  . TETANUS/TDAP  Never done  . COLONOSCOPY (Pts 45-63yrs Insurance  coverage will need to be confirmed)  Never done  . MAMMOGRAM  Never done  . DEXA SCAN  Never done  . PNA vac Low Risk Adult (1 of 2 - PCV13) Never done  . INFLUENZA VACCINE  Never done    Colorectal cancer screening: Patient states she had done in 2019 but does not know the doctor's name.  Mammogram  status: Patient states she has done with GYN. Requested copy of results be sent to PCP.  Bone Density status:Patient states she has done with GYN. Requested copy of results be sent to PCP.  Lung Cancer Screening: (Low Dose CT Chest recommended if Age 34-80 years, 30 pack-year currently smoking OR have quit w/in 15years.) does not qualify.    Additional Screening:  Hepatitis C Screening: does qualify; Discuss with PCP  Vision Screening: Recommended annual ophthalmology exams for early detection of glaucoma and other disorders of the eye. Is the patient up to date with their annual eye exam?  Yes  Who is the provider or what is the name of the office in which the patient attends annual eye exams? [place] Screening: Recommended annual dental exams for proper oral hygiene  Community Resource Referral / Chronic Care Management: CRR required this visit?  No   CCM required this visit?  No      Plan:     I have personally reviewed and noted the following in the patient's chart:   . Medical and social history . Use of alcohol, tobacco or illicit drugs  . Current medications and supplements . Functional ability and status . Nutritional status . Physical activity . Advanced directives . List of other physicians . Hospitalizations, surgeries, and ER visits in previous 12 months . Vitals . Screenings to include cognitive, depression, and falls . Referrals and appointments  In addition, I have reviewed and discussed with patient certain preventive protocols, quality metrics, and best practice recommendations. A written personalized care plan for preventive services as well  as general preventive health recommendations were provided to patient.   Due to this being a telephonic visit, the after visit summary with patients personalized plan was offered to patient via mail or my-chart. Per request, patient was mailed a copy of [place], LPN   [id number]  Nurse Health Advisor  Nurse Notes: None

## 2020-11-01 ENCOUNTER — Ambulatory Visit (INDEPENDENT_AMBULATORY_CARE_PROVIDER_SITE_OTHER): Payer: Medicare HMO

## 2020-11-01 VITALS — Ht 62.5 in | Wt 133.0 lb

## 2020-11-01 DIAGNOSIS — Z0000 Encounter for general adult medical examination without abnormal findings: Secondary | ICD-10-CM | POA: Diagnosis not present

## 2020-11-01 NOTE — Patient Instructions (Signed)
Priscilla Washington , Thank you for taking time to complete your Medicare Wellness Visit. I appreciate your ongoing commitment to your health goals. Please review the following plan we discussed and let me know if I can assist you in the future.   Screening recommendations/referrals: Colonoscopy: Per our conversation, completed 2019. No results available. Please follow the recommendations of the GI doctor for follow up. Mammogram: Per our conversation, ordered by GYN. Please have results sent to PCP Bone Density: Per our conversation, ordered by GYN. Please have results sent to PCP Recommended yearly ophthalmology/optometry visit for glaucoma screening and checkup Recommended yearly dental visit for hygiene and checkup  Vaccinations: Influenza vaccine: Declined Pneumococcal vaccine: Declined Tdap vaccine: Discuss with pharmacy Shingles vaccine: Declined   Covid-19:Completed vaccines  Advanced directives: Please bring a copy for your chart  Conditions/risks identified: See problem list  Next appointment: Follow up in one year for your annual wellness visit    Preventive Care 65 Years and Older, Female Preventive care refers to lifestyle choices and visits with your health care provider that can promote health and wellness. What does preventive care include?  A yearly physical exam. This is also called an annual well check.  Dental exams once or twice a year.  Routine eye exams. Ask your health care provider how often you should have your eyes checked.  Personal lifestyle choices, including:  Daily care of your teeth and gums.  Regular physical activity.  Eating a healthy diet.  Avoiding tobacco and drug use.  Limiting alcohol use.  Practicing safe sex.  Taking low-dose aspirin every day.  Taking vitamin and mineral supplements as recommended by your health care provider. What happens during an annual well check? The services and screenings done by your health care provider  during your annual well check will depend on your age, overall health, lifestyle risk factors, and family history of disease. Counseling  Your health care provider may ask you questions about your:  Alcohol use.  Tobacco use.  Drug use.  Emotional well-being.  Home and relationship well-being.  Sexual activity.  Eating habits.  History of falls.  Memory and ability to understand (cognition).  Work and work Statistician.  Reproductive health. Screening  You may have the following tests or measurements:  Height, weight, and BMI.  Blood pressure.  Lipid and cholesterol levels. These may be checked every 5 years, or more frequently if you are over 57 years old.  Skin check.  Lung cancer screening. You may have this screening every year starting at age 44 if you have a 30-pack-year history of smoking and currently smoke or have quit within the past 15 years.  Fecal occult blood test (FOBT) of the stool. You may have this test every year starting at age 70.  Flexible sigmoidoscopy or colonoscopy. You may have a sigmoidoscopy every 5 years or a colonoscopy every 10 years starting at age 14.  Hepatitis C blood test.  Hepatitis B blood test.  Sexually transmitted disease (STD) testing.  Diabetes screening. This is done by checking your blood sugar (glucose) after you have not eaten for a while (fasting). You may have this done every 1-3 years.  Bone density scan. This is done to screen for osteoporosis. You may have this done starting at age 3.  Mammogram. This may be done every 1-2 years. Talk to your health care provider about how often you should have regular mammograms. Talk with your health care provider about your test results, treatment options, and if  necessary, the need for more tests. Vaccines  Your health care provider may recommend certain vaccines, such as:  Influenza vaccine. This is recommended every year.  Tetanus, diphtheria, and acellular pertussis  (Tdap, Td) vaccine. You may need a Td booster every 10 years.  Zoster vaccine. You may need this after age 80.  Pneumococcal 13-valent conjugate (PCV13) vaccine. One dose is recommended after age 72.  Pneumococcal polysaccharide (PPSV23) vaccine. One dose is recommended after age 25. Talk to your health care provider about which screenings and vaccines you need and how often you need them. This information is not intended to replace advice given to you by your health care provider. Make sure you discuss any questions you have with your health care provider. Document Released: 10/01/2015 Document Revised: 05/24/2016 Document Reviewed: 07/06/2015 Elsevier Interactive Patient Education  2017 [place] Prevention in the Home Falls can cause injuries. They can happen to people of all ages. There are many things you can do to make your home safe and to help prevent falls. What can I do on the outside of my home?  Regularly fix the edges of walkways and driveways and fix any cracks.  Remove anything that might make you trip as you walk through a door, such as a raised step or threshold.  Trim any bushes or trees on the path to your home.  Use bright outdoor lighting.  Clear any walking paths of anything that might make someone trip, such as rocks or tools.  Regularly check to see if handrails are loose or broken. Make sure that both sides of any steps have handrails.  Any raised decks and porches should have guardrails on the edges.  Have any leaves, snow, or ice cleared regularly.  Use sand or salt on walking paths during winter.  Clean up any spills in your garage right away. This includes oil or grease spills. What can I do in the bathroom?  Use night lights.  Install grab bars by the toilet and in the tub and shower. Do not use towel bars as grab bars.  Use non-skid mats or decals in the tub or shower.  If you need to sit down in the shower, use a plastic, non-slip  stool.  Keep the floor dry. Clean up any water that spills on the floor as soon as it happens.  Remove soap buildup in the tub or shower regularly.  Attach bath mats securely with double-sided non-slip rug tape.  Do not have throw rugs and other things on the floor that can make you trip. What can I do in the bedroom?  Use night lights.  Make sure that you have a light by your bed that is easy to reach.  Do not use any sheets or blankets that are too big for your bed. They should not hang down onto the floor.  Have a firm chair that has side arms. You can use this for support while you get dressed.  Do not have throw rugs and other things on the floor that can make you trip. What can I do in the kitchen?  Clean up any spills right away.  Avoid walking on wet floors.  Keep items that you use a lot in easy-to-reach places.  If you need to reach something above you, use a strong step stool that has a grab bar.  Keep electrical cords out of the way.  Do not use floor polish or wax that makes floors slippery. If you must  use wax, use non-skid floor wax.  Do not have throw rugs and other things on the floor that can make you trip. What can I do with my stairs?  Do not leave any items on the stairs.  Make sure that there are handrails on both sides of the stairs and use them. Fix handrails that are broken or loose. Make sure that handrails are as long as the stairways.  Check any carpeting to make sure that it is firmly attached to the stairs. Fix any carpet that is loose or worn.  Avoid having throw rugs at the top or bottom of the stairs. If you do have throw rugs, attach them to the floor with carpet tape.  Make sure that you have a light switch at the top of the stairs and the bottom of the stairs. If you do not have them, ask someone to add them for you. What else can I do to help prevent falls?  Wear shoes that:  Do not have high heels.  Have rubber bottoms.  Are  comfortable and fit you well.  Are closed at the toe. Do not wear sandals.  If you use a stepladder:  Make sure that it is fully opened. Do not climb a closed stepladder.  Make sure that both sides of the stepladder are locked into place.  Ask someone to hold it for you, if possible.  Clearly mark and make sure that you can see:  Any grab bars or handrails.  First and last steps.  Where the edge of each step is.  Use tools that help you move around (mobility aids) if they are needed. These include:  Canes.  Walkers.  Scooters.  Crutches.  Turn on the lights when you go into a dark area. Replace any light bulbs as soon as they burn out.  Set up your furniture so you have a clear path. Avoid moving your furniture around.  If any of your floors are uneven, fix them.  If there are any pets around you, be aware of where they are.  Review your medicines with your doctor. Some medicines can make you feel dizzy. This can increase your chance of falling. Ask your doctor what other things that you can do to help prevent falls. This information is not intended to replace advice given to you by your health care provider. Make sure you discuss any questions you have with your health care provider. Document Released: 07/01/2009 Document Revised: 02/10/2016 Document Reviewed: 10/09/2014 Elsevier Interactive Patient Education  2017 [employer].

## 2020-11-22 ENCOUNTER — Encounter: Payer: Medicare HMO | Admitting: Family Medicine

## 2020-11-29 ENCOUNTER — Other Ambulatory Visit: Payer: Self-pay

## 2020-11-29 ENCOUNTER — Encounter: Payer: Self-pay | Admitting: Family Medicine

## 2020-11-29 ENCOUNTER — Ambulatory Visit (INDEPENDENT_AMBULATORY_CARE_PROVIDER_SITE_OTHER): Payer: Medicare HMO | Admitting: Family Medicine

## 2020-11-29 VITALS — BP 160/98 | HR 70 | Temp 97.6°F | Ht 62.5 in | Wt 136.8 lb

## 2020-11-29 DIAGNOSIS — G119 Hereditary ataxia, unspecified: Secondary | ICD-10-CM | POA: Diagnosis not present

## 2020-11-29 DIAGNOSIS — E782 Mixed hyperlipidemia: Secondary | ICD-10-CM

## 2020-11-29 DIAGNOSIS — R030 Elevated blood-pressure reading, without diagnosis of hypertension: Secondary | ICD-10-CM

## 2020-11-29 DIAGNOSIS — Z800 Family history of malignant neoplasm of digestive organs: Secondary | ICD-10-CM | POA: Insufficient documentation

## 2020-11-29 DIAGNOSIS — M810 Age-related osteoporosis without current pathological fracture: Secondary | ICD-10-CM

## 2020-11-29 LAB — CBC WITH DIFFERENTIAL/PLATELET
Basophils Absolute: 0 10*3/uL (ref 0.0–0.1)
Basophils Relative: 0.8 % (ref 0.0–3.0)
Eosinophils Absolute: 0 10*3/uL (ref 0.0–0.7)
Eosinophils Relative: 0.9 % (ref 0.0–5.0)
HCT: 39.5 % (ref 36.0–46.0)
Hemoglobin: 12.9 g/dL (ref 12.0–15.0)
Lymphocytes Relative: 40.2 % (ref 12.0–46.0)
Lymphs Abs: 1.5 10*3/uL (ref 0.7–4.0)
MCHC: 32.5 g/dL (ref 30.0–36.0)
MCV: 86.3 fl (ref 78.0–100.0)
Monocytes Absolute: 0.3 10*3/uL (ref 0.1–1.0)
Monocytes Relative: 8.2 % (ref 3.0–12.0)
Neutro Abs: 1.8 10*3/uL (ref 1.4–7.7)
Neutrophils Relative %: 49.9 % (ref 43.0–77.0)
Platelets: 233 10*3/uL (ref 150.0–400.0)
RBC: 4.58 Mil/uL (ref 3.87–5.11)
RDW: 14.9 % (ref 11.5–15.5)
WBC: 3.7 10*3/uL — ABNORMAL LOW (ref 4.0–10.5)

## 2020-11-29 LAB — COMPREHENSIVE METABOLIC PANEL
ALT: 24 U/L (ref 0–35)
AST: 29 U/L (ref 0–37)
Albumin: 4.1 g/dL (ref 3.5–5.2)
Alkaline Phosphatase: 50 U/L (ref 39–117)
BUN: 17 mg/dL (ref 6–23)
CO2: 28 mEq/L (ref 19–32)
Calcium: 9.7 mg/dL (ref 8.4–10.5)
Chloride: 105 mEq/L (ref 96–112)
Creatinine, Ser: 0.75 mg/dL (ref 0.40–1.20)
GFR: 81.9 mL/min (ref >60.00)
Glucose, Bld: 105 mg/dL — ABNORMAL HIGH (ref 70–99)
Potassium: 3.8 mEq/L (ref 3.5–5.1)
Sodium: 142 mEq/L (ref 135–145)
Total Bilirubin: 0.7 mg/dL (ref 0.2–1.2)
Total Protein: 6.9 g/dL (ref 6.0–8.3)

## 2020-11-29 LAB — LIPID PANEL
Cholesterol: 250 mg/dL — ABNORMAL HIGH (ref 0–200)
HDL: 84.6 mg/dL (ref >39.00)
LDL Cholesterol: 156 mg/dL — ABNORMAL HIGH (ref 0–99)
NonHDL: 165.66
Total CHOL/HDL Ratio: 3
Triglycerides: 48 mg/dL (ref 0.0–149.0)
VLDL: 9.6 mg/dL (ref 0.0–40.0)

## 2020-11-29 LAB — TSH: TSH: 1.16 u[IU]/mL (ref 0.35–4.50)

## 2020-11-29 NOTE — Patient Instructions (Signed)
-  routine labs today, you look great! I would not start back your baby aspirin.    Blood pressure is high. Keep a log for me for a month and then send me the readings!   So nice to meet you! Dr. Rogers Blocker

## 2020-11-29 NOTE — Progress Notes (Signed)
Patient: Priscilla Washington MRN: [id number] DOB: 19-Jan-1951 PCP: Priscilla Washington     Subjective:  Chief Complaint  Patient presents with  . Transitions Of Care  . Hyperlipidemia    HPI: The patient is a 70 y.o. female who presents today for TOC. she has no concerns today. 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 She also has a history of colon cancer in her mother. Followed by dr. Collene Washington with cscopes q 5 years.   Hyperlipidemia She has hx of CAD in her father and brother. She has never smoked, does not have diabetes and does not have HTN. HDL >90.   The 10-year ASCVD risk score Priscilla Bussing DC Jr., et al., 2013) is: 18.8%* (Cholesterol units were assumed)   Review of Systems  Constitutional: Negative for chills, fatigue and fever.  HENT: Negative for dental problem, ear pain, hearing loss and trouble swallowing.   Eyes: Negative for visual disturbance.  Respiratory: Negative for cough, chest tightness and shortness of breath.   Cardiovascular: Negative for chest pain, palpitations and leg swelling.  Gastrointestinal: Negative for abdominal pain, blood in stool, diarrhea and nausea.  Endocrine: Negative for cold intolerance, polydipsia, polyphagia and polyuria.  Genitourinary: Negative for dysuria and hematuria.  Musculoskeletal: Positive for gait problem (baseline). Negative for arthralgias.  Skin: Negative for rash.  Neurological: Negative for dizziness and headaches.  Psychiatric/Behavioral: Negative for dysphoric mood and sleep disturbance. The patient is not nervous/anxious.     Allergies Patient is allergic to demerol [meperidine].  Past Medical History Patient  has a past medical history of Allergy, Arthritis, Cerebellar ataxia ([place]), and Osteoporosis.  Surgical History Patient  has a past surgical history that includes Abdominal hysterectomy (Bilateral, 1999,2000).  Family History Pateint's family history includes  Colon cancer in her mother and another family member; Diabetes in her sister and sister; Heart attack in her brother and father; Heart disease in her brother; Stroke in her sister.  Social History Patient  reports that she has never smoked. She has never used smokeless tobacco. She reports that she does not drink alcohol and does not use drugs.    Objective: Vitals:   11/29/20 1025 11/29/20 1236  BP: (!) 143/89 (!) 160/98  Pulse: 70   Temp: 97.6 F (36.4 C)   TempSrc: Temporal   SpO2: 100%   Weight: 136 lb 12.8 oz (62.1 kg)   Height: 5' 2.5" (1.588 m)     Body mass index is 24.62 kg/m.  Physical Exam Vitals reviewed.  Constitutional:      Appearance: Normal appearance. She is well-developed and normal weight.  HENT:     Head: Normocephalic and atraumatic.     Right Ear: Tympanic membrane, ear canal and external ear normal.     Left Ear: Tympanic membrane, ear canal and external ear normal.     Nose: Nose normal.     Mouth/Throat:     Mouth: Mucous membranes are moist.  Eyes:     Extraocular Movements: Extraocular movements intact.     Conjunctiva/sclera: Conjunctivae normal.     Pupils: Pupils are equal, round, and reactive to light.  Neck:     Thyroid: No thyromegaly.     Vascular: No carotid bruit.  Cardiovascular:     Rate and Rhythm: Normal rate and regular rhythm.     Heart sounds: Normal heart sounds. No murmur heard.   Pulmonary:     Effort: Pulmonary effort is normal.  Breath sounds: Normal breath sounds.  Abdominal:     General: Abdomen is flat. Bowel sounds are normal. There is no distension.     Palpations: Abdomen is soft.     Tenderness: There is no abdominal tenderness.  Musculoskeletal:     Cervical back: Normal range of motion and neck supple.  Lymphadenopathy:     Cervical: No cervical adenopathy.  Skin:    General: Skin is warm and dry.     Capillary Refill: Capillary refill takes less than 2 seconds.     Findings: No rash.   Neurological:     General: No focal deficit present.     Mental Status: She is alert and oriented to person, place, and time.     Cranial Nerves: No cranial nerve deficit.     Coordination: Coordination normal.     Deep Tendon Reflexes: Reflexes normal.     Comments: Slight tremor.   Psychiatric:        Mood and Affect: Mood normal.        Behavior: Behavior normal.    [place] Office Visit from 11/29/2020 in [place]  PHQ-2 Total Score 0          Assessment/plan: 1. 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 Uses walker.  - CBC with Differential/Platelet - Comprehensive metabolic panel - TSH  2. Mixed hyperlipidemia HDL over 90. Will check today and calculate ASCVD risk.  - Lipid panel  3. Elevated blood pressure reading x2 elevated readings. They are going to get a cuff and start home log. Will let me know how readings are in a month and may need to bring her back in. DASH diet discussed. Continue exercising.   4. Family history of colon cancer in mother UTD on regular screening.   5. Osteoporosis without current pathological fracture, unspecified osteoporosis type Followed by gyn. Asked to get records so I can see where she is at. They will request to be sent over.    This visit occurred during the SARS-CoV-2 public health emergency.  Safety protocols were in place, including screening questions prior to the visit, additional usage of staff PPE, and extensive cleaning of exam room while observing appropriate contact time as indicated for disinfecting solutions.     Return if symptoms worsen or fail to improve.   Priscilla Flaming, MD [place]   11/29/2020

## 2020-12-02 ENCOUNTER — Telehealth: Payer: Self-pay

## 2020-12-02 ENCOUNTER — Other Ambulatory Visit: Payer: Self-pay | Admitting: Family Medicine

## 2020-12-02 DIAGNOSIS — E782 Mixed hyperlipidemia: Secondary | ICD-10-CM | POA: Insufficient documentation

## 2020-12-02 MED ORDER — ROSUVASTATIN CALCIUM 5 MG PO TABS: 5.0000 mg | ORAL_TABLET | Freq: Every day | ORAL | 3 refills | Status: DC

## 2020-12-02 NOTE — Telephone Encounter (Signed)
Pt would like the statin sent in that her and Dr. Rogers Blocker discussed in her appointment

## 2020-12-06 NOTE — Telephone Encounter (Signed)
I spoke with the pt to give message below. She expressed understanding.

## 2020-12-06 NOTE — Telephone Encounter (Signed)
I sent this in on 12/02/20. It's called crestor.  Dr. Rogers Blocker

## 2020-12-06 NOTE — Telephone Encounter (Signed)
Pt called following up on this prescription. Please advise.

## 2021-03-28 ENCOUNTER — Encounter: Payer: Self-pay | Admitting: Family Medicine

## 2021-03-28 ENCOUNTER — Ambulatory Visit (INDEPENDENT_AMBULATORY_CARE_PROVIDER_SITE_OTHER): Payer: Medicare HMO | Admitting: Family Medicine

## 2021-03-28 ENCOUNTER — Other Ambulatory Visit: Payer: Self-pay

## 2021-03-28 VITALS — BP 156/89 | HR 78 | Temp 97.9°F | Ht 62.5 in | Wt 139.6 lb

## 2021-03-28 DIAGNOSIS — M810 Age-related osteoporosis without current pathological fracture: Secondary | ICD-10-CM

## 2021-03-28 DIAGNOSIS — E782 Mixed hyperlipidemia: Secondary | ICD-10-CM | POA: Diagnosis not present

## 2021-03-28 NOTE — Patient Instructions (Signed)
It was very nice to see you today!  We will order bone density scan.  We can get this done at the same time as her mammogram.  No other changes today.  I will see back in 1 year for your regular checkup.  Please come back to see me sooner if needed.  Take care, Dr Jerline Pain  PLEASE NOTE:  If you had any lab tests please let us know if you have not heard back within a few days. You may see your results on mychart before we have a chance to review them but we will give you a call once they are reviewed by [place]. If we ordered any referrals today, please let us know if you have not heard from their office within the next week.   Please try these tips to maintain a healthy lifestyle:  Eat at least 3 REAL meals and 1-2 snacks per day.  Aim for no more than 5 hours between eating.  If you eat breakfast, please do so within one hour of getting up.   Each meal should contain half fruits/vegetables, one quarter protein, and one quarter carbs (no bigger than a computer mouse)  Cut down on sweet beverages. This includes juice, soda, and sweet tea.   Drink at least 1 glass of water with each meal and aim for at least 8 glasses per day  Exercise at least 150 minutes every week.

## 2021-03-28 NOTE — Progress Notes (Signed)
   Priscilla Washington is a 70 y.o. female who presents today for an office visit.  Assessment/Plan:  New/Acute Problems: Elevated blood pressure Typically well controlled and at goal at home.  Discussed lifestyle modifications.  They will continue home monitoring and let me know if persistently 150/90 or higher.  Chronic Problems Addressed Today: Hyperlipidemia ASCVD 18.7% Continue Crestor 5 mg daily.  Check lipids next year.  Osteoporosis Will order DEXA scan.  Preventative health care Will get mammogram and bone density scan soon.  Up-to-date on colon cancer screening.    Subjective:  HPI: No acute problems today.   See A/p for status of chronic conditions   She works with a Physiological scientist for physical therapy 3 days a week to deal with cerebellar ataxia symptoms. Her daughter does her home readings for blood pressure; 120-130 over 40 is usual for her.         Objective:  Physical Exam: BP (!) 156/89   Pulse 78   Temp 97.9 F (36.6 C) (Temporal)   Ht 5' 2.5" (1.588 m)   Wt 139 lb 9.6 oz (63.3 kg)   SpO2 100%   BMI 25.13 kg/m   Gen: No acute distress, resting comfortably CV: Regular rate and rhythm with no murmurs appreciated Pulm: Normal work of breathing, clear to auscultation bilaterally with no crackles, wheezes, or rhonchi Neuro: Grossly normal, moves all extremities Psych: Normal affect and thought content      I,Jordan Kelly,acting as a scribe for Dimas Chyle, MD.,have documented all relevant documentation on the behalf of Dimas Chyle, MD,as directed by  Dimas Chyle, MD while in the presence of Dimas Chyle, MD.  I, Dimas Chyle, MD, have reviewed all documentation for this visit. The documentation on 03/28/21 for the exam, diagnosis, procedures, and orders are all accurate and complete.  Algis Greenhouse. Jerline Pain, MD 03/28/2021 3:37 PM

## 2021-03-28 NOTE — Assessment & Plan Note (Signed)
Continue Crestor 5 mg daily.  Check lipids next year.

## 2021-03-28 NOTE — Assessment & Plan Note (Signed)
Will order DEXA scan.

## 2021-04-08 ENCOUNTER — Other Ambulatory Visit: Payer: Self-pay | Admitting: Family Medicine

## 2021-04-08 DIAGNOSIS — Z1231 Encounter for screening mammogram for malignant neoplasm of breast: Secondary | ICD-10-CM

## 2021-06-01 ENCOUNTER — Ambulatory Visit: Payer: Medicare HMO

## 2021-06-08 ENCOUNTER — Other Ambulatory Visit: Payer: Self-pay

## 2021-06-08 ENCOUNTER — Ambulatory Visit
Admission: RE | Admit: 2021-06-08 | Discharge: 2021-06-08 | Disposition: A | Discharge status: Home / Self Care | Payer: Medicare HMO | Source: Ambulatory Visit | Attending: Family Medicine | Admitting: Family Medicine

## 2021-06-08 DIAGNOSIS — Z1231 Encounter for screening mammogram for malignant neoplasm of breast: Secondary | ICD-10-CM

## 2021-08-03 ENCOUNTER — Encounter: Payer: Self-pay | Admitting: Family Medicine

## 2021-08-03 IMAGING — MR MR HEAD W/O CM
10 of 11 series · 35 of 48 positions shown · non-contrast
Comparison: None.

CLINICAL DATA: Ataxia.  Progressive tremors.  Balance disturbance.

EXAM:
MRI HEAD WITHOUT CONTRAST
TECHNIQUE: Multiplanar, multiecho pulse sequences of the brain and surrounding
structures were obtained without intravenous contrast.

[Series 2: T1 · sagittal · 5.0mm · 0.45mm/px · 2 of 23 slices shown]
[im 1/23]
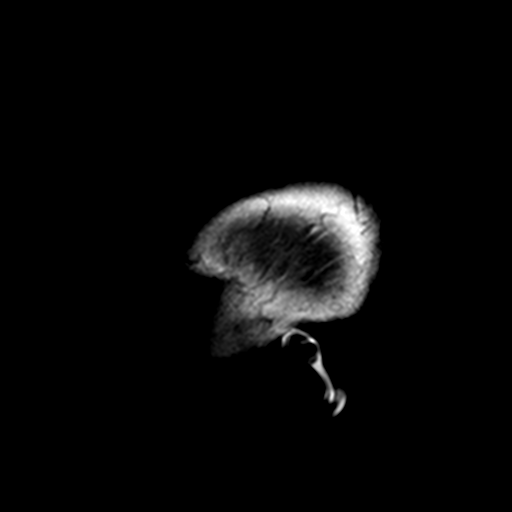
[im 23/23]
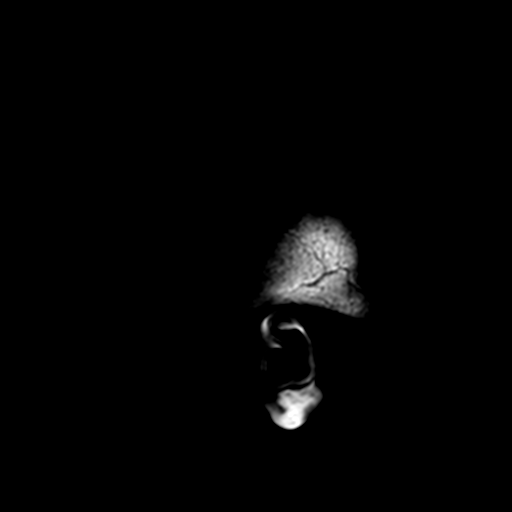

[Series 3: DWI · axial · 3.0mm · 2.00mm/px · z∈[-57,+90]mm · 9 of 97 slices shown (1 of 4)]
[im 1/97]
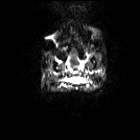
[im 13/97]
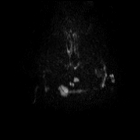
[im 25/97]
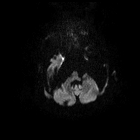
[im 37/97]
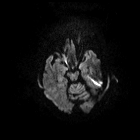
[im 49/97]
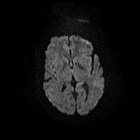
[im 61/97]
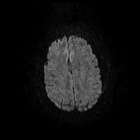
[im 73/97]
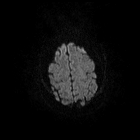
[im 85/97]
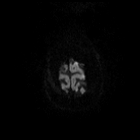
[im 97/97]
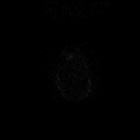

[Series 4: DWI · axial · 3.0mm · 2.00mm/px · z∈[-57,+90]mm · 4 of 50 slices shown (2 of 4)]
[im 1/50]
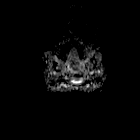
[im 17/50]
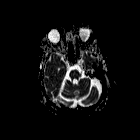
[im 33/50]
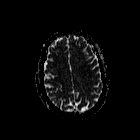
[im 50/50]
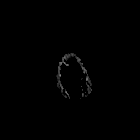

[Series 5: DWI · coronal · 5.0mm · 1.46mm/px · 7 of 76 slices shown (3 of 4)]
[im 1/76]
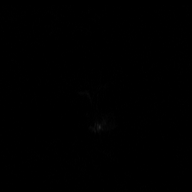
[im 13/76]
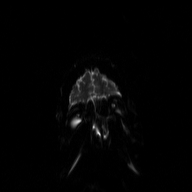
[im 26/76]
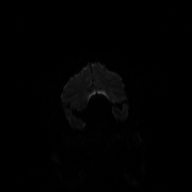
[im 38/76]
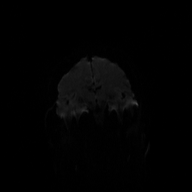
[im 51/76]
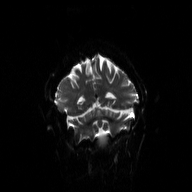
[im 63/76]
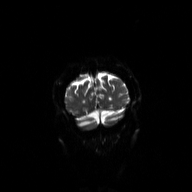
[im 76/76]
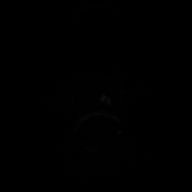

[Series 6: DWI · coronal · 5.0mm · 1.46mm/px · 3 of 38 slices shown (4 of 4)]
[im 1/38]
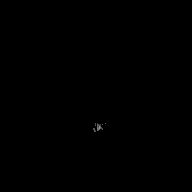
[im 19/38]
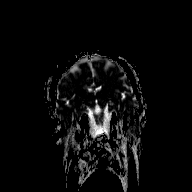
[im 38/38]
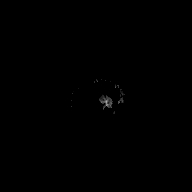

[Series 7: T2 · axial · 5.0mm · 0.51mm/px · z∈[-68,+77]mm · 2 of 22 slices shown (1 of 2)]
[im 1/22]
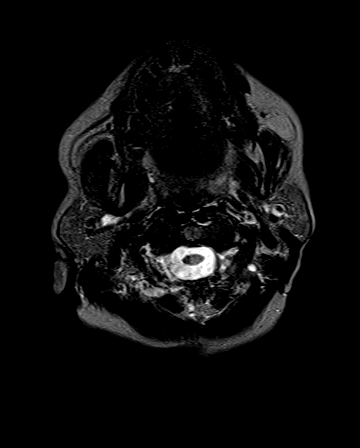
[im 22/22]
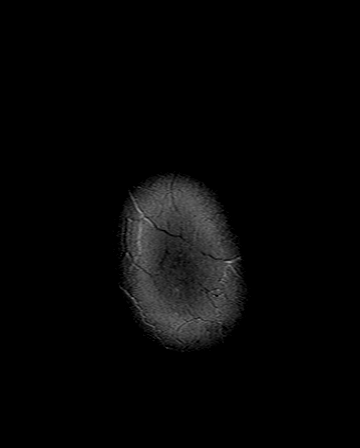

[Series 8: FLAIR · axial · 3.0mm · 0.72mm/px · z∈[-65,+74]mm · 3 of 36 slices shown]
[im 1/36]
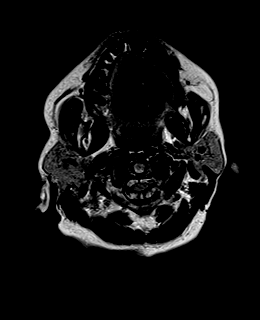
[im 18/36]
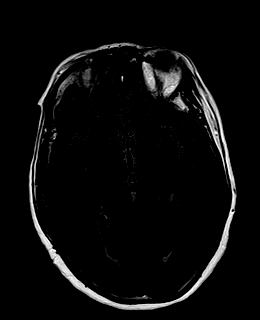
[im 36/36]
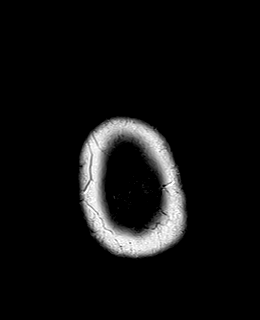

[Series 9: T2 · axial · 5.0mm · 0.45mm/px · z∈[-68,+78]mm · 2 of 22 slices shown (2 of 2)]
[im 1/22]
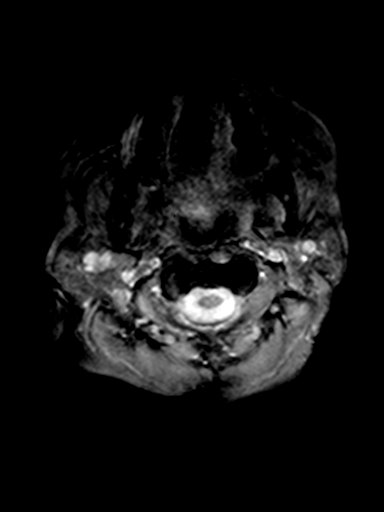
[im 22/22]
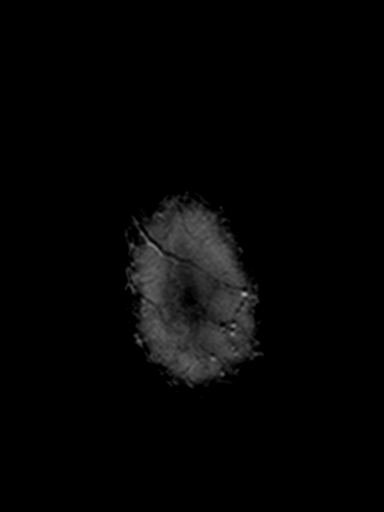

[Series 11: T2 post-contrast · coronal · 5.0mm · 0.45mm/px · 2 of 28 slices shown]
[im 1/28]
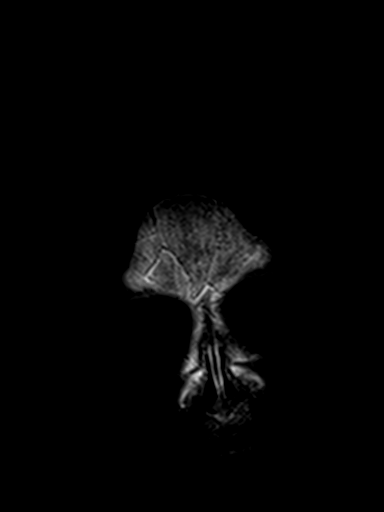
[im 28/28]
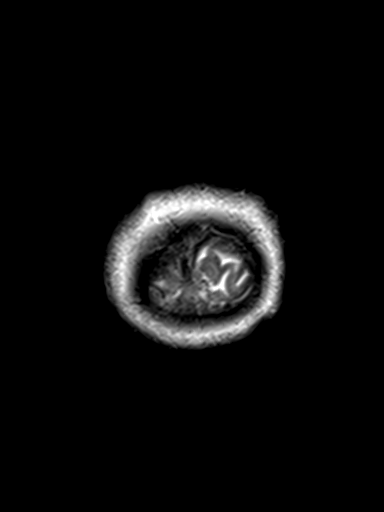

[Series 100: hx · axial · 10.0mm · 0.55mm/px · 1 of 9 slices shown]
[im 1/9]
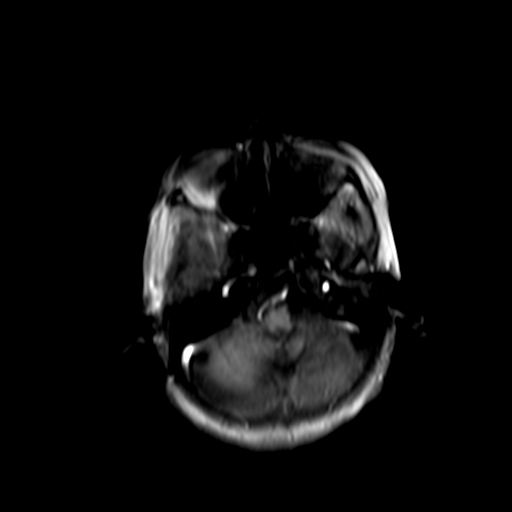

[35 of 48 positions shown; findings below may reference images not displayed]

FINDINGS: Brain: Diffusion imaging does not show any acute or subacute
infarction. There is bilateral generalized cerebellar atrophy. There
is atrophic change of the brainstem and cervical spinal cord.
Cerebral hemispheres show only minimal age related volume loss.
Minimal small vessel change of the cerebral hemispheric white
matter. No cortical or large vessel territory infarction. No mass
lesion, hemorrhage, hydrocephalus or extra-axial collection.

Vascular: Major vessels at the base of the brain show flow.

Skull and upper cervical spine: Negative

Sinuses/Orbits: Clear/normal

Other: None
IMPRESSION: Atrophy of the cerebellum, brainstem and cervical cord. Consistent
with spinocerebellar atrophy. Involvement of all 3 of these regions
can be seen with SCA 3 and SCA 7.

[URL]

## 2021-09-29 ENCOUNTER — Other Ambulatory Visit: Payer: Medicare HMO

## 2021-10-05 ENCOUNTER — Ambulatory Visit
Admission: RE | Admit: 2021-10-05 | Discharge: 2021-10-05 | Disposition: A | Discharge status: Home / Self Care | Payer: Medicare HMO | Source: Ambulatory Visit | Attending: Family Medicine | Admitting: Family Medicine

## 2021-10-05 DIAGNOSIS — M810 Age-related osteoporosis without current pathological fracture: Secondary | ICD-10-CM

## 2021-10-06 NOTE — Progress Notes (Signed)
Please inform patient of the following:  Her bone density scan shows that she has osteoporosis. Can we have her schedule an appointment to discuss treatment options?

## 2021-10-12 ENCOUNTER — Encounter: Payer: Self-pay | Admitting: Family Medicine

## 2021-10-13 NOTE — Telephone Encounter (Signed)
Please see message and advise 

## 2021-10-14 ENCOUNTER — Encounter: Payer: Self-pay | Admitting: Family Medicine

## 2021-10-15 NOTE — Telephone Encounter (Signed)
Please schedule appointment with PCP, virtual or office visit

## 2021-10-19 NOTE — Telephone Encounter (Signed)
Patient scheduled.

## 2021-10-21 ENCOUNTER — Telehealth (INDEPENDENT_AMBULATORY_CARE_PROVIDER_SITE_OTHER): Payer: Medicare HMO | Admitting: Family Medicine

## 2021-10-21 DIAGNOSIS — M810 Age-related osteoporosis without current pathological fracture: Secondary | ICD-10-CM | POA: Diagnosis not present

## 2021-10-21 DIAGNOSIS — E782 Mixed hyperlipidemia: Secondary | ICD-10-CM

## 2021-10-21 MED ORDER — ALENDRONATE SODIUM 70 MG PO TABS: 70.0000 mg | ORAL_TABLET | ORAL | 3 refills | Status: DC

## 2021-10-21 NOTE — Progress Notes (Signed)
° °  Priscilla Washington is a 71 y.o. female who presents today for a virtual office visit.  Assessment/Plan:  Chronic Problems Addressed Today: Osteoporosis (T score -3 DEXA 09/2021) T score -3 on recent DEXA scan.  She is on calcium and vitamin D supplementation.  Had a lengthy discussion with patient and her daughter regarding treatment options.  She is agreeable to start Fosamax 70 mg once weekly.  We discussed potential side effects.  She will let me know if she has any issues.  We also discussed Prolia however she would like to defer for now.  We can recheck DEXA scan in a couple of years.  Dyslipidemia On Crestor 5 mg daily.  Will check lipids next blood draw.     Subjective:  HPI:  Patient here today to discuss recent bone density scan.  She has been taking calcium and vitamin D supplementation.  She has never been on any medication to help with osteoporosis.        Objective/Observations  Physical Exam: Gen: NAD, resting comfortably Pulm: Normal work of breathing Neuro: Grossly normal, moves all extremities Psych: Normal affect and thought content  Virtual Visit via Video   I connected with Twylia Oka Severino on 10/21/21 at 10:40 AM EST by a video enabled telemedicine application and verified that I am speaking with the correct person using two identifiers. The limitations of evaluation and management by telemedicine and the availability of in person appointments were discussed. The patient expressed understanding and agreed to proceed.   Patient location: Home Provider location: [place] participating in the virtual visit: Myself and Patient     Algis Greenhouse. Jerline Pain, MD 10/21/2021 11:10 AM

## 2021-10-21 NOTE — Assessment & Plan Note (Signed)
T score -3 on recent DEXA scan.  She is on calcium and vitamin D supplementation.  Had a lengthy discussion with patient and her daughter regarding treatment options.  She is agreeable to start Fosamax 70 mg once weekly.  We discussed potential side effects.  She will let me know if she has any issues.  We also discussed Prolia however she would like to defer for now.  We can recheck DEXA scan in a couple of years.

## 2021-10-21 NOTE — Assessment & Plan Note (Signed)
On Crestor 5 mg daily.  Will check lipids next blood draw.

## 2021-11-03 ENCOUNTER — Other Ambulatory Visit: Payer: Self-pay

## 2021-11-03 ENCOUNTER — Ambulatory Visit (INDEPENDENT_AMBULATORY_CARE_PROVIDER_SITE_OTHER): Payer: Medicare HMO

## 2021-11-03 DIAGNOSIS — Z0000 Encounter for general adult medical examination without abnormal findings: Secondary | ICD-10-CM | POA: Diagnosis not present

## 2021-11-03 NOTE — Progress Notes (Signed)
Virtual Visit via Telephone Note  I connected with  Zyria Fiscus Whidden on 11/03/21 at 10:15 AM EST by telephone and verified that I am speaking with the correct person using two identifiers.  Medicare Annual Wellness visit completed telephonically due to Covid-19 pandemic.   Persons participating in this call: This Health Coach and this patient.   Location: Patient: Home Provider: Office   I discussed the limitations, risks, security and privacy concerns of performing an evaluation and management service by telephone and the availability of in person appointments. The patient expressed understanding and agreed to proceed.  Unable to perform video visit due to video visit attempted and failed and/or patient does not have video capability.   Some vital signs may be absent or patient reported.   Willette Brace, LPN   Subjective:   Galia Rahm Mckey is a 71 y.o. female who presents for Medicare Annual (Subsequent) preventive examination.  Review of Systems     Cardiac Risk Factors include: advanced age (>39men, >59 women);dyslipidemia     Objective:    There were no vitals filed for this visit. There is no height or weight on file to calculate BMI.  Advanced Directives 11/03/2021 11/01/2020 06/03/2020  Does Patient Have a Medical Advance Directive? Yes Yes No  Type of Paramedic of [place];Living will -  Copy of [place] in Chart? No - copy requested No - copy requested -    Current Medications (verified) Outpatient Encounter Medications as of 11/03/2021  Medication Sig   alendronate (FOSAMAX) 70 MG tablet Take 1 tablet (70 mg total) by mouth every 7 (seven) days. Take with a full glass of water on an empty stomach.   B Complex Vitamins (B COMPLEX-B12 PO) Take 1 tablet by mouth daily.   calcium citrate-vitamin D (CITRACAL+D) 315-200 MG-UNIT tablet Take 1 tablet by mouth daily.   Multiple Vitamin  (MULTIVITAMIN) tablet Take 1 tablet by mouth daily.   rosuvastatin (CRESTOR) 5 MG tablet Take 1 tablet (5 mg total) by mouth daily.   No facility-administered encounter medications on file as of 11/03/2021.    Allergies (verified) Demerol [meperidine]   History: Past Medical History:  Diagnosis Date   Allergy    Arthritis    Cerebellar ataxia ([place])    Reported by daughter   Osteoporosis    Past Surgical History:  Procedure Laterality Date   ABDOMINAL HYSTERECTOMY Bilateral 1999,2000   Family History  Problem Relation Age of Onset   Colon cancer Mother    Heart attack Father    Colon cancer Other    Diabetes Sister    Heart disease Brother    Diabetes Sister    Stroke Sister    Heart attack Brother    Social History   Socioeconomic History   Marital status: Divorced    Spouse name: Not on file   Number of children: 2   Years of education: MBA   Highest education level: Not on file  Occupational History   Occupation: school teacher    Employer: [employer]  Tobacco Use   Smoking status: Never   Smokeless tobacco: Never  Vaping Use   Vaping Use: Never used  Substance and Sexual Activity   Alcohol use: Never   Drug use: Never   Sexual activity: Not Currently  Other Topics Concern   Not on file  Social History Narrative   Patient is a Education officer, 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 She lives by herself. She denies smoking, drinking.   Social Determinants of Health   Financial Resource Strain: Not on file  Food Insecurity: Not on file  Transportation Needs: Not on file  Physical Activity: Insufficiently Active   Days of Exercise per Week: 3 days   Minutes of Exercise per Session: 30 min  Stress: No Stress Concern Present   Feeling of Stress : Not at all  Social Connections: Moderately Isolated   Frequency of Communication with Friends and Family: More than three times a week   Frequency of Social  Gatherings with Friends and Family: More than three times a week   Attends Religious Services: More than 4 times per year   Active Member of [employer] or Organizations: No   Attends Music therapist: Never   Marital Status: Divorced    Tobacco Counseling Counseling given: Not Answered   Clinical Intake:  Pre-visit preparation completed: Yes  Pain : No/denies pain     BMI - recorded: 25.13 Nutritional Status: BMI 25 -29 Overweight Nutritional Risks: None Diabetes: No  How often do you need to have someone help you when you read instructions, pamphlets, or other written materials from your doctor or pharmacy?: 1 - Never  Diabetic?no  Interpreter Needed?: No  Information entered by :: Charlott Rakes, LPN   Activities of Daily Living In your present state of health, do you have any difficulty performing the following activities: 11/03/2021  Hearing? N  Vision? N  Difficulty concentrating or making decisions? N  Walking or climbing stairs? N  Dressing or bathing? N  Doing errands, shopping? N  Preparing Food and eating ? N  Using the Toilet? N  In the past six months, have you accidently leaked urine? N  Do you have problems with loss of bowel control? N  Managing your Medications? N  Managing your Finances? N  Housekeeping or managing your Housekeeping? N  Some recent data might be hidden    Patient Care Team: Vivi Barrack, MD as PCP - General (Family Medicine) Hoyt Koch, MD as Referring Physician (Obstetrics and Gynecology) Tat, Eustace Quail, DO as Consulting Physician (Neurology) Juanita Craver, MD as Consulting Physician (Gastroenterology)  Indicate any recent Medical Services you may have received from other than [facility] providers in the past year (date may be approximate).     Assessment:   This is a routine wellness examination for [place].  Hearing/Vision screen Hearing Screening - Comments:: Pt denies any hearing issues  Vision Screening -  Comments:: [facility]   Dietary issues and exercise activities discussed: Current Exercise Habits: Home exercise routine, Type of exercise: Other - see comments, Time (Minutes): 30, Frequency (Times/Week): 3, Weekly Exercise (Minutes/Week): 90   Goals Addressed             This Visit's Progress    Patient Stated       To be able to walk without walker        Depression Screen PHQ 2/9 Scores 11/03/2021 03/28/2021 11/29/2020 11/01/2020 04/19/2020  PHQ - 2 Score 0 0 0 0 0    Fall Risk Fall Risk  11/03/2021 03/28/2021 11/29/2020 11/01/2020 06/03/2020  Falls in the past year? 0 0 1 1 0  Number falls in past yr: 0 0 0 0 0  Injury with Fall? 0 0 - 0 0  Risk for fall due to : Impaired vision;Impaired balance/gait;Impaired mobility Impaired balance/gait - History  of fall(s) -  Follow up Falls prevention discussed - - Falls prevention discussed -    FALL RISK PREVENTION PERTAINING TO THE HOME:  Any stairs in or around the home? No  If so, are there any without handrails? No  Home free of loose throw rugs in walkways, pet beds, electrical cords, etc? Yes  Adequate lighting in your home to reduce risk of falls? Yes   ASSISTIVE DEVICES UTILIZED TO PREVENT FALLS:  Life alert? Yes  Use of a cane, walker or w/c? Yes  Grab bars in the bathroom? Yes  Shower chair or bench in shower? Yes  Elevated toilet seat or a handicapped toilet? No   TIMED UP AND GO:  Was the test performed? No .    Cognitive Function:     6CIT Screen 11/03/2021  What Year? 0 points  What month? 0 points  What time? 0 points  Count back from 20 0 points  Months in reverse 0 points  Repeat phrase 4 points  Total Score 4    Immunizations Immunization History  Administered Date(s) Administered   Moderna SARS-COV2 Booster Vaccination 07/22/2020   Moderna Sars-Covid-2 Vaccination 11/10/2019, 12/16/2019      Flu Vaccine status: Declined, Education has been provided  regarding the importance of this vaccine but patient still declined. Advised may receive this vaccine at local pharmacy or Health Dept. Aware to provide a copy of the vaccination record if obtained from local pharmacy or Health Dept. Verbalized acceptance and understanding.  Pneumococcal vaccine status: Declined,  Education has been provided regarding the importance of this vaccine but patient still declined. Advised may receive this vaccine at local pharmacy or Health Dept. Aware to provide a copy of the vaccination record if obtained from local pharmacy or Health Dept. Verbalized acceptance and understanding.   Covid-19 vaccine status: Completed vaccines  Qualifies for Shingles Vaccine? Yes   Zostavax completed No   Shingrix Completed?: No.    Education has been provided regarding the importance of this vaccine. Patient has been advised to call insurance company to determine out of pocket expense if they have not yet received this vaccine. Advised may also receive vaccine at local pharmacy or Health Dept. Verbalized acceptance and understanding.  Screening Tests Health Maintenance  Topic Date Due   Hepatitis C Screening  Never done   TETANUS/TDAP  Never done   Zoster Vaccines- Shingrix (1 of 2) Never done   Pneumonia Vaccine 85+ Years old (1 - PCV) Never done   COVID-19 Vaccine (3 - Booster for Moderna series) 09/16/2020   INFLUENZA VACCINE  12/16/2021 (Originally 04/18/2021)   MAMMOGRAM  06/09/2023   DEXA SCAN  10/06/2023   COLONOSCOPY (Pts 45-37yrs Insurance coverage will need to be confirmed)  09/28/2027   HPV VACCINES  Aged Out    Health Maintenance  Health Maintenance Due  Topic Date Due   Hepatitis C Screening  Never done   TETANUS/TDAP  Never done   Zoster Vaccines- Shingrix (1 of 2) Never done   Pneumonia Vaccine 56+ Years old (1 - PCV) Never done   COVID-19 Vaccine (3 - Booster for Moderna series) 09/16/2020    Colorectal cancer screening: Type of screening: Colonoscopy.  Completed 09/27/17. Repeat every 10 years  Mammogram status: Completed 06/08/21. Repeat every year  Bone Density status: Completed 10/05/21. Results reflect: Bone density results: OSTEOPOROSIS. Repeat every 2 years.    Additional Screening:  Hepatitis C Screening: does qualify;   Vision Screening: Recommended annual ophthalmology exams for early  detection of glaucoma and other disorders of the eye. Is the patient up to date with their annual eye exam?  Yes  Who is the provider or what is the name of the office in which the patient attends annual eye exams? [facility]  If pt is not established with a provider, would they like to be referred to a provider to establish care? No .   Dental Screening: Recommended annual dental exams for proper oral hygiene  Community Resource Referral / Chronic Care Management: CRR required this visit?  No   CCM required this visit?  No      Plan:     I have personally reviewed and noted the following in the patients chart:   Medical and social history Use of alcohol, tobacco or illicit drugs  Current medications and supplements including opioid prescriptions.  Functional ability and status Nutritional status Physical activity Advanced directives List of other physicians Hospitalizations, surgeries, and ER visits in previous 12 months Vitals Screenings to include cognitive, depression, and falls Referrals and appointments  In addition, I have reviewed and discussed with patient certain preventive protocols, quality metrics, and best practice recommendations. A written personalized care plan for preventive services as well as general preventive health recommendations were provided to patient.     Willette Brace, LPN   02/13/4131   Nurse Notes: None

## 2021-11-03 NOTE — Patient Instructions (Signed)
Priscilla Washington , Thank you for taking time to come for your Medicare Wellness Visit. I appreciate your ongoing commitment to your health goals. Please review the following plan we discussed and let me know if I can assist you in the future.   Screening recommendations/referrals: Colonoscopy: Done 09/27/17 repeat every 10 years  Mammogram: Done 06/08/21 repeat every year  Bone Density: Done 10/05/21 repeat every 2 years  Recommended yearly ophthalmology/optometry visit for glaucoma screening and checkup Recommended yearly dental visit for hygiene and checkup  Vaccinations: Influenza vaccine: Declined and discussed Pneumococcal vaccine: Declined and discussed  Tdap vaccine: Due and discussed  Shingles vaccine: Shingrix discussed. Please contact your pharmacy for coverage information.    Covid-19:Completed 2/22, 3/30, & 07/22/20  Advanced directives: Please bring a copy of your health care power of attorney and living will to the office at your convenience.  Conditions/risks identified: To walk without walker   Next appointment: Follow up in one year for your annual wellness visit    Preventive Care 70 Years and Older, Female Preventive care refers to lifestyle choices and visits with your health care provider that can promote health and wellness. What does preventive care include? A yearly physical exam. This is also called an annual well check. Dental exams once or twice a year. Routine eye exams. Ask your health care provider how often you should have your eyes checked. Personal lifestyle choices, including: Daily care of your teeth and gums. Regular physical activity. Eating a healthy diet. Avoiding tobacco and drug use. Limiting alcohol use. Practicing safe sex. Taking low-dose aspirin every day. Taking vitamin and mineral supplements as recommended by your health care provider. What happens during an annual well check? The services and screenings done by your health care provider  during your annual well check will depend on your age, overall health, lifestyle risk factors, and family history of disease. Counseling  Your health care provider may ask you questions about your: Alcohol use. Tobacco use. Drug use. Emotional well-being. Home and relationship well-being. Sexual activity. Eating habits. History of falls. Memory and ability to understand (cognition). Work and work Statistician. Reproductive health. Screening  You may have the following tests or measurements: Height, weight, and BMI. Blood pressure. Lipid and cholesterol levels. These may be checked every 5 years, or more frequently if you are over 51 years old. Skin check. Lung cancer screening. You may have this screening every year starting at age 70 if you have a 30-pack-year history of smoking and currently smoke or have quit within the past 70 years. Fecal occult blood test (FOBT) of the stool. You may have this test every year starting at age 70. Flexible sigmoidoscopy or colonoscopy. You may have a sigmoidoscopy every 5 years or a colonoscopy every 10 years starting at age 70. Hepatitis C blood test. Hepatitis B blood test. Sexually transmitted disease (STD) testing. Diabetes screening. This is done by checking your blood sugar (glucose) after you have not eaten for a while (fasting). You may have this done every 1-3 years. Bone density scan. This is done to screen for osteoporosis. You may have this done starting at age 70. Mammogram. This may be done every 1-2 years. Talk to your health care provider about how often you should have regular mammograms. Talk with your health care provider about your test results, treatment options, and if necessary, the need for more tests. Vaccines  Your health care provider may recommend certain vaccines, such as: Influenza vaccine. This is recommended every year.  Tetanus, diphtheria, and acellular pertussis (Tdap, Td) vaccine. You may need a Td booster every  10 years. Zoster vaccine. You may need this after age 70. Pneumococcal 13-valent conjugate (PCV13) vaccine. One dose is recommended after age 70. Pneumococcal polysaccharide (PPSV23) vaccine. One dose is recommended after age 70. Talk to your health care provider about which screenings and vaccines you need and how often you need them. This information is not intended to replace advice given to you by your health care provider. Make sure you discuss any questions you have with your health care provider. Document Released: 10/01/2015 Document Revised: 05/24/2016 Document Reviewed: 07/06/2015 Elsevier Interactive Patient Education  2017 [place] Prevention in the Home Falls can cause injuries. They can happen to people of all ages. There are many things you can do to make your home safe and to help prevent falls. What can I do on the outside of my home? Regularly fix the edges of walkways and driveways and fix any cracks. Remove anything that might make you trip as you walk through a door, such as a raised step or threshold. Trim any bushes or trees on the path to your home. Use bright outdoor lighting. Clear any walking paths of anything that might make someone trip, such as rocks or tools. Regularly check to see if handrails are loose or broken. Make sure that both sides of any steps have handrails. Any raised decks and porches should have guardrails on the edges. Have any leaves, snow, or ice cleared regularly. Use sand or salt on walking paths during winter. Clean up any spills in your garage right away. This includes oil or grease spills. What can I do in the bathroom? Use night lights. Install grab bars by the toilet and in the tub and shower. Do not use towel bars as grab bars. Use non-skid mats or decals in the tub or shower. If you need to sit down in the shower, use a plastic, non-slip stool. Keep the floor dry. Clean up any water that spills on the floor as soon as it  happens. Remove soap buildup in the tub or shower regularly. Attach bath mats securely with double-sided non-slip rug tape. Do not have throw rugs and other things on the floor that can make you trip. What can I do in the bedroom? Use night lights. Make sure that you have a light by your bed that is easy to reach. Do not use any sheets or blankets that are too big for your bed. They should not hang down onto the floor. Have a firm chair that has side arms. You can use this for support while you get dressed. Do not have throw rugs and other things on the floor that can make you trip. What can I do in the kitchen? Clean up any spills right away. Avoid walking on wet floors. Keep items that you use a lot in easy-to-reach places. If you need to reach something above you, use a strong step stool that has a grab bar. Keep electrical cords out of the way. Do not use floor polish or wax that makes floors slippery. If you must use wax, use non-skid floor wax. Do not have throw rugs and other things on the floor that can make you trip. What can I do with my stairs? Do not leave any items on the stairs. Make sure that there are handrails on both sides of the stairs and use them. Fix handrails that are broken or loose.  Make sure that handrails are as long as the stairways. Check any carpeting to make sure that it is firmly attached to the stairs. Fix any carpet that is loose or worn. Avoid having throw rugs at the top or bottom of the stairs. If you do have throw rugs, attach them to the floor with carpet tape. Make sure that you have a light switch at the top of the stairs and the bottom of the stairs. If you do not have them, ask someone to add them for you. What else can I do to help prevent falls? Wear shoes that: Do not have high heels. Have rubber bottoms. Are comfortable and fit you well. Are closed at the toe. Do not wear sandals. If you use a stepladder: Make sure that it is fully opened.  Do not climb a closed stepladder. Make sure that both sides of the stepladder are locked into place. Ask someone to hold it for you, if possible. Clearly mark and make sure that you can see: Any grab bars or handrails. First and last steps. Where the edge of each step is. Use tools that help you move around (mobility aids) if they are needed. These include: Canes. Walkers. Scooters. Crutches. Turn on the lights when you go into a dark area. Replace any light bulbs as soon as they burn out. Set up your furniture so you have a clear path. Avoid moving your furniture around. If any of your floors are uneven, fix them. If there are any pets around you, be aware of where they are. Review your medicines with your doctor. Some medicines can make you feel dizzy. This can increase your chance of falling. Ask your doctor what other things that you can do to help prevent falls. This information is not intended to replace advice given to you by your health care provider. Make sure you discuss any questions you have with your health care provider. Document Released: 07/01/2009 Document Revised: 02/10/2016 Document Reviewed: 10/09/2014 Elsevier Interactive Patient Education  2017 [employer].

## 2021-12-08 ENCOUNTER — Ambulatory Visit (INDEPENDENT_AMBULATORY_CARE_PROVIDER_SITE_OTHER): Payer: Medicare HMO | Admitting: Family Medicine

## 2021-12-08 ENCOUNTER — Encounter: Payer: Self-pay | Admitting: Family Medicine

## 2021-12-08 VITALS — BP 171/98 | HR 90 | Temp 98.4°F | Ht 62.5 in | Wt 141.6 lb

## 2021-12-08 DIAGNOSIS — E782 Mixed hyperlipidemia: Secondary | ICD-10-CM

## 2021-12-08 DIAGNOSIS — Z0001 Encounter for general adult medical examination with abnormal findings: Secondary | ICD-10-CM

## 2021-12-08 DIAGNOSIS — G119 Hereditary ataxia, unspecified: Secondary | ICD-10-CM | POA: Diagnosis not present

## 2021-12-08 DIAGNOSIS — M810 Age-related osteoporosis without current pathological fracture: Secondary | ICD-10-CM

## 2021-12-08 DIAGNOSIS — Z23 Encounter for immunization: Secondary | ICD-10-CM

## 2021-12-08 LAB — LIPID PANEL
Cholesterol: 165 mg/dL (ref 0–200)
HDL: 83.4 mg/dL (ref >39.00)
LDL Cholesterol: 74 mg/dL (ref 0–99)
NonHDL: 81.89
Total CHOL/HDL Ratio: 2
Triglycerides: 40 mg/dL (ref 0.0–149.0)
VLDL: 8 mg/dL (ref 0.0–40.0)

## 2021-12-08 LAB — CBC
HCT: 40.1 % (ref 36.0–46.0)
Hemoglobin: 12.8 g/dL (ref 12.0–15.0)
MCHC: 32 g/dL (ref 30.0–36.0)
MCV: 86.8 fl (ref 78.0–100.0)
Platelets: 235 10*3/uL (ref 150.0–400.0)
RBC: 4.62 Mil/uL (ref 3.87–5.11)
RDW: 14.9 % (ref 11.5–15.5)
WBC: 4.4 10*3/uL (ref 4.0–10.5)

## 2021-12-08 LAB — TSH: TSH: 1.16 u[IU]/mL (ref 0.35–5.50)

## 2021-12-08 LAB — COMPREHENSIVE METABOLIC PANEL
ALT: 23 U/L (ref 0–35)
AST: 27 U/L (ref 0–37)
Albumin: 4.6 g/dL (ref 3.5–5.2)
Alkaline Phosphatase: 47 U/L (ref 39–117)
BUN: 16 mg/dL (ref 6–23)
CO2: 29 mEq/L (ref 19–32)
Calcium: 9.4 mg/dL (ref 8.4–10.5)
Chloride: 106 mEq/L (ref 96–112)
Creatinine, Ser: 0.82 mg/dL (ref 0.40–1.20)
GFR: 73.06 mL/min (ref >60.00)
Glucose, Bld: 98 mg/dL (ref 70–99)
Potassium: 3.9 mEq/L (ref 3.5–5.1)
Sodium: 144 mEq/L (ref 135–145)
Total Bilirubin: 0.6 mg/dL (ref 0.2–1.2)
Total Protein: 7.1 g/dL (ref 6.0–8.3)

## 2021-12-08 NOTE — Assessment & Plan Note (Signed)
On Crestor 5 mg daily.  We discussed lifestyle modifications.  Check labs today. ?

## 2021-12-08 NOTE — Addendum Note (Signed)
Addended by: Verlon Setting on: 12/08/2021 11:35 AM ? ? Modules accepted: Orders ? ?

## 2021-12-08 NOTE — Assessment & Plan Note (Signed)
Symptoms are currently manageable.  She has seen neurology in the past for this.  We discussed home health referral for PT or home health aide however they declined for now. ?

## 2021-12-08 NOTE — Assessment & Plan Note (Signed)
Doing well Fosamax '70mg'$  weekly.  Needs repeat DEXA scan in 2 years. ?

## 2021-12-08 NOTE — Progress Notes (Addendum)
? ?Chief Complaint:  ?Priscilla Washington is a 71 y.o. female who presents today for her annual comprehensive physical exam.   ? ?Assessment/Plan:  ?Chronic Problems Addressed Today: ?Dyslipidemia ?On Crestor 5 mg daily.  We discussed lifestyle modifications.  Check labs today. ? ?Osteoporosis (T score -3 DEXA 09/2021) ?Doing well Fosamax '70mg'$  weekly.  Needs repeat DEXA scan in 2 years. ? ?Cerebellar ataxia ([place]) ?Symptoms are currently manageable.  She has seen neurology in the past for this.  We discussed home health referral for PT or home health aide however they declined for now. ? ?Elevated BP Reading ?No red flags or signs of endorgan damage.  We will check labs today.  She is typically been well controlled.  She has a lot of anxiety around being at the doctor's office which is likely contributing.  We discussed home monitoring.  He will let me know if persistently elevated at home.  We discussed lifestyle modifications. ? ?Preventative Healthcare: ?Check labs. PRevnar 20 given today. Discussed shingle vaccine.  Up-to-date on colon cancer screening and mammogram.  Due for next DEXA scan in 2 years. ? ?Patient Counseling(The following topics were reviewed and/or handout was given): ? -Nutrition: Stressed importance of moderation in sodium/caffeine intake, saturated fat and cholesterol, caloric balance, sufficient intake of fresh fruits, vegetables, and fiber. ? -Stressed the importance of regular exercise.  ? -Substance Abuse: Discussed cessation/primary prevention of tobacco, alcohol, or other drug use; driving or other dangerous activities under the influence; availability of treatment for abuse.  ? -Injury prevention: Discussed safety belts, safety helmets, smoke detector, smoking near bedding or upholstery.  ? -Sexuality: Discussed sexually transmitted diseases, partner selection, use of condoms, avoidance of unintended pregnancy and contraceptive alternatives.  ? -Dental health: Discussed importance of  regular tooth brushing, flossing, and dental visits. ? -Health maintenance and immunizations reviewed. Please refer to Health maintenance section. ? ?Return to care in 1 year for next preventative visit.  ? ?  ?Subjective:  ?HPI: ? ?She has no acute complaints today.  ? ?She is here with her daughter today. Her blood pressure was elevated in the office today. Usually in the 120's at home. She notes she has some highs at home. Her blood pressure at home occassionally 160's or 170's. She has not been on any medication to help with hypertension. She has been doing well since visit. We started her on Fosamax in last video visit for osteoporosis.  She is tolerating her medication without any side effects.  ? ?Lifestyle ?Diet: Balanced: Plenty of fruits and vegetables. ?Exercise: Limited. ? ? ?  11/03/2021  ? 10:18 AM  ?Depression screen PHQ 2/9  ?Decreased Interest 0  ?Down, Depressed, Hopeless 0  ?PHQ - 2 Score 0  ? ? ?Health Maintenance Due  ?Topic Date Due  ? Hepatitis C Screening  Never done  ? COVID-19 Vaccine (5 - Booster for Moderna series) 10/05/2021  ?  ? ?ROS: Per HPI, otherwise a complete review of systems was negative.  ? ?PMH: ? ?The following were reviewed and entered/updated in epic: ?Past Medical History:  ?Diagnosis Date  ? Allergy   ? Arthritis   ? Cerebellar ataxia ([place])   ? Reported by daughter  ? Osteoporosis   ? ?Patient Active Problem List  ? Diagnosis Date Noted  ? Dyslipidemia 12/02/2020  ? Family history of colon cancer in mother 11/29/2020  ? Osteoporosis (T score -3 DEXA 09/2021) 11/29/2020  ? Cerebellar ataxia ([place]) 11/05/2018  ? ?Past Surgical History:  ?Procedure  Laterality Date  ? ABDOMINAL HYSTERECTOMY Bilateral 1999,2000  ? ? ?Family History  ?Problem Relation Age of Onset  ? Colon cancer Mother   ? Heart attack Father   ? Colon cancer Other   ? Diabetes Sister   ? Heart disease Brother   ? Diabetes Sister   ? Stroke Sister   ? Heart attack Brother   ? ? ?Medications- reviewed and  updated ?Current Outpatient Medications  ?Medication Sig Dispense Refill  ? alendronate (FOSAMAX) 70 MG tablet Take 1 tablet (70 mg total) by mouth every 7 (seven) days. Take with a full glass of water on an empty stomach. 12 tablet 3  ? B Complex Vitamins (B COMPLEX-B12 PO) Take 1 tablet by mouth daily.    ? calcium citrate-vitamin D (CITRACAL+D) 315-200 MG-UNIT tablet Take 1 tablet by mouth daily.    ? Multiple Vitamin (MULTIVITAMIN) tablet Take 1 tablet by mouth daily.    ? rosuvastatin (CRESTOR) 5 MG tablet Take 1 tablet (5 mg total) by mouth daily. 90 tablet 3  ? ?No current facility-administered medications for this visit.  ? ? ?Allergies-reviewed and updated ?Allergies  ?Allergen Reactions  ? Demerol [Meperidine]   ? ? ?Social History  ? ?Socioeconomic History  ? Marital status: Divorced  ?  Spouse name: Not on file  ? Number of children: 2  ? Years of education: MBA  ? Highest education level: Not on file  ?Occupational History  ? Occupation: Education officer, museum  ?  Employer: [employer]  ?Tobacco Use  ? Smoking status: Never  ? Smokeless tobacco: Never  ?Vaping Use  ? Vaping Use: Never used  ?Substance and Sexual Activity  ? Alcohol use: Never  ? Drug use: Never  ? Sexual activity: Not Currently  ?Other Topics Concern  ? Not on file  ?Social History Narrative  ? 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 She lives by herself. She denies smoking, drinking.  ? ?Social Determinants of Health  ? ?Financial Resource Strain: Not on file  ?Food Insecurity: Not on file  ?Transportation Needs: Not on file  ?Physical Activity: Insufficiently Active  ? Days of Exercise per Week: 3 days  ? Minutes of Exercise per Session: 30 min  ?Stress: No Stress Concern Present  ? Feeling of Stress : Not at all  ?Social Connections: Moderately Isolated  ? Frequency of Communication with Friends and Family: More than three times a week  ? Frequency of Social Gatherings  with Friends and Family: More than three times a week  ? Attends Religious Services: More than 4 times per year  ? Active Member of Clubs or Organizations: No  ? Attends Archivist Meetings: Never  ? Marital Status: Divorced  ? ?   ?  ?Objective:  ?Physical Exam: ?BP (!) 171/98 (BP Location: Left Arm)   Pulse 90   Temp 98.4 ?F (36.9 ?C) (Temporal)   Ht 5' 2.5" (1.588 m)   Wt 141 lb 9.6 oz (64.2 kg)   SpO2 98%   BMI 25.49 kg/m?   ?Body mass index is 25.49 kg/m?. ?Wt Readings from Last 3 Encounters:  ?12/08/21 141 lb 9.6 oz (64.2 kg)  ?10/21/21 139 lb (63 kg)  ?03/28/21 139 lb 9.6 oz (63.3 kg)  ? ?Gen: NAD, resting comfortably ?HEENT: TMs normal bilaterally. OP clear. No thyromegaly noted.  ?CV: RRR with no murmurs appreciated ?Pulm: NWOB, CTAB with no crackles, wheezes, or rhonchi ?GI: Normal bowel  sounds present. Soft, Nontender, Nondistended. ?MSK: no edema, cyanosis, or clubbing noted ?Skin: warm, dry ?Neuro: CN2-12 grossly intact. Ataxia noted. Strength 5/5 in upper and lower extremities. Reflexes symmetric and intact bilaterally.  ?Psych: Normal affect and thought content ?   ? ? ?I,Savera Zaman,acting as a Education administrator for Dimas Chyle, MD.,have documented all relevant documentation on the behalf of Dimas Chyle, MD,as directed by  Dimas Chyle, MD while in the presence of Dimas Chyle, MD.  ? ?I, Dimas Chyle, MD, have reviewed all documentation for this visit. The documentation on 12/08/21 for the exam, diagnosis, procedures, and orders are all accurate and complete. ? ?Algis Greenhouse. Jerline Pain, MD ?12/08/2021 11:31 AM  ? ?

## 2021-12-08 NOTE — Patient Instructions (Signed)
It was very nice to see you today! ? ?We will check blood work today. ? ?Please keep an eye on your blood pressure and let me know if it is persistently elevated. ? ?We will see back in year.  Come back sooner if needed. ? ?Take care, ?Dr Jerline Pain ? ?PLEASE NOTE: ? ?If you had any lab tests please let us know if you have not heard back within a few days. You may see your results on mychart before we have a chance to review them but we will give you a call once they are reviewed by [place]. If we ordered any referrals today, please let us know if you have not heard from their office within the next week.  ? ?Please try these tips to maintain a healthy lifestyle: ? ?Eat at least 3 REAL meals and 1-2 snacks per day.  Aim for no more than 5 hours between eating.  If you eat breakfast, please do so within one hour of getting up.  ? ?Each meal should contain half fruits/vegetables, one quarter protein, and one quarter carbs (no bigger than a computer mouse) ? ?Cut down on sweet beverages. This includes juice, soda, and sweet tea.  ? ?Drink at least 1 glass of water with each meal and aim for at least 8 glasses per day ? ?Exercise at least 150 minutes every week.   ? ?Preventive Care 12 Years and Older, Female ?Preventive care refers to lifestyle choices and visits with your health care provider that can promote health and wellness. Preventive care visits are also called wellness exams. ?What can I expect for my preventive care visit? ?Counseling ?Your health care provider may ask you questions about your: ?Medical history, including: ?Past medical problems. ?Family medical history. ?Pregnancy and menstrual history. ?History of falls. ?Current health, including: ?Memory and ability to understand (cognition). ?Emotional well-being. ?Home life and relationship well-being. ?Sexual activity and sexual health. ?Lifestyle, including: ?Alcohol, nicotine or tobacco, and drug use. ?Access to firearms. ?Diet, exercise, and sleep  habits. ?Work and work Statistician. ?Sunscreen use. ?Safety issues such as seatbelt and bike helmet use. ?Physical exam ?Your health care provider will check your: ?Height and weight. These may be used to calculate your BMI (body mass index). BMI is a measurement that tells if you are at a healthy weight. ?Waist circumference. This measures the distance around your waistline. This measurement also tells if you are at a healthy weight and may help predict your risk of certain diseases, such as type 2 diabetes and high blood pressure. ?Heart rate and blood pressure. ?Body temperature. ?Skin for abnormal spots. ?What immunizations do I need? ?Vaccines are usually given at various ages, according to a schedule. Your health care provider will recommend vaccines for you based on your age, medical history, and lifestyle or other factors, such as travel or where you work. ?What tests do I need? ?Screening ?Your health care provider may recommend screening tests for certain conditions. This may include: ?Lipid and cholesterol levels. ?Hepatitis C test. ?Hepatitis B test. ?HIV (human immunodeficiency virus) test. ?STI (sexually transmitted infection) testing, if you are at risk. ?Lung cancer screening. ?Colorectal cancer screening. ?Diabetes screening. This is done by checking your blood sugar (glucose) after you have not eaten for a while (fasting). ?Mammogram. Talk with your health care provider about how often you should have regular mammograms. ?BRCA-related cancer screening. This may be done if you have a family history of breast, ovarian, tubal, or peritoneal cancers. ?Bone density scan. This  is done to screen for osteoporosis. ?Talk with your health care provider about your test results, treatment options, and if necessary, the need for more tests. ?Follow these instructions at home: ?Eating and drinking ? ?Eat a diet that includes fresh fruits and vegetables, whole grains, lean protein, and low-fat dairy products.  Limit your intake of foods with high amounts of sugar, saturated fats, and salt. ?Take vitamin and mineral supplements as recommended by your health care provider. ?Do not drink alcohol if your health care provider tells you not to drink. ?If you drink alcohol: ?Limit how much you have to 0-1 drink a day. ?Know how much alcohol is in your drink. In the [place] one drink equals one 12 oz bottle of beer (355 mL), one 5 oz glass of wine (148 mL), or one 1? oz glass of hard liquor (44 mL). ?Lifestyle ?Brush your teeth every morning and night with fluoride toothpaste. Floss one time each day. ?Exercise for at least 30 minutes 5 or more days each week. ?Do not use any products that contain nicotine or tobacco. These products include cigarettes, chewing tobacco, and vaping devices, such as e-cigarettes. If you need help quitting, ask your health care provider. ?Do not use drugs. ?If you are sexually active, practice safe sex. Use a condom or other form of protection in order to prevent STIs. ?Take aspirin only as told by your health care provider. Make sure that you understand how much to take and what form to take. Work with your health care provider to find out whether it is safe and beneficial for you to take aspirin daily. ?Ask your health care provider if you need to take a cholesterol-lowering medicine (statin). ?Find healthy ways to manage stress, such as: ?Meditation, yoga, or listening to music. ?Journaling. ?Talking to a trusted person. ?Spending time with friends and family. ?Minimize exposure to UV radiation to reduce your risk of skin cancer. ?Safety ?Always wear your seat belt while driving or riding in a vehicle. ?Do not drive: ?If you have been drinking alcohol. Do not ride with someone who has been drinking. ?When you are tired or distracted. ?While texting. ?If you have been using any mind-altering substances or drugs. ?Wear a helmet and other protective equipment during sports activities. ?If you have  firearms in your house, make sure you follow all gun safety procedures. ?What's next? ?Visit your health care provider once a year for an annual wellness visit. ?Ask your health care provider how often you should have your eyes and teeth checked. ?Stay up to date on all vaccines. ?This information is not intended to replace advice given to you by your health care provider. Make sure you discuss any questions you have with your health care provider. ?Document Revised: 03/02/2021 Document Reviewed: 03/02/2021 ?Elsevier Patient Education ? [place]. ? ?

## 2021-12-12 NOTE — Progress Notes (Signed)
Please inform patient of the following: ? ?Good news! Labs are all stable. Would like for her to keep up the good work and we can recheck in a year. ? ?Algis Greenhouse. Jerline Pain, MD ?12/12/2021 12:43 PM  ?

## 2021-12-26 ENCOUNTER — Encounter: Payer: Medicare HMO | Admitting: Family Medicine

## 2022-01-03 ENCOUNTER — Other Ambulatory Visit: Payer: Self-pay | Admitting: *Deleted

## 2022-01-03 MED ORDER — ROSUVASTATIN CALCIUM 5 MG PO TABS: 5.0000 mg | ORAL_TABLET | Freq: Every day | ORAL | 3 refills | Status: DC

## 2022-01-04 ENCOUNTER — Encounter: Payer: Self-pay | Admitting: Family Medicine

## 2022-01-05 ENCOUNTER — Other Ambulatory Visit: Payer: Self-pay | Admitting: *Deleted

## 2022-01-05 MED ORDER — ALENDRONATE SODIUM 70 MG PO TABS: 70.0000 mg | ORAL_TABLET | ORAL | 3 refills | Status: DC

## 2022-01-05 MED ORDER — ROSUVASTATIN CALCIUM 5 MG PO TABS: 5.0000 mg | ORAL_TABLET | Freq: Every day | ORAL | 3 refills | Status: DC

## 2022-01-05 NOTE — Telephone Encounter (Signed)
Rx refills send to [facility], Patient notified  ?

## 2022-04-03 ENCOUNTER — Telehealth: Payer: Self-pay | Admitting: Neurology

## 2022-04-03 NOTE — Telephone Encounter (Signed)
Called and spoke to patients daughter and informed her that they will need to contact the PCP for this. Informed patients daughter it has been 2 years since patient was seen in office and we do not do appointments for just [employer]. Patients daughter verbalized understanding and had no further questions or concerns.

## 2022-04-03 NOTE — Telephone Encounter (Signed)
Patients daughter tiffany called, mother has jury duty and needs forms to get out of it. I let her know the last time tat saw her mother was 2021, and she may need to come in and see her. Told her I would reach out to tat and let her know.

## 2022-04-04 ENCOUNTER — Telehealth: Payer: Self-pay | Admitting: Family Medicine

## 2022-04-04 NOTE — Telephone Encounter (Signed)
Patient's daughter Priscilla Washington requests Dr. Jerline Pain write a letter with a full explanation of why Patient is physically unable to serve on jury duty. Patient was summoned for jury duty on 05/25/22. Tiffany states Patient is unable to physically maneuver.  Tiffany requests to be called at ph# [phone] for status of the above request.

## 2022-04-05 NOTE — Telephone Encounter (Signed)
Ok to [employer]?

## 2022-04-05 NOTE — Telephone Encounter (Signed)
[place] with writing letter she should be excused due to her underlying medical conditions including cerebellar ataxia which inhibits her ability to ambulate or sit for long periods of time.  Algis Greenhouse. Jerline Pain, MD 04/05/2022 8:19 AM

## 2022-04-06 ENCOUNTER — Encounter: Payer: Self-pay | Admitting: *Deleted

## 2022-04-06 NOTE — Telephone Encounter (Signed)
Letter done, patient Daughter notified  Letter placed at front office ready to be pickup

## 2022-04-06 NOTE — Telephone Encounter (Signed)
Patient's daughter Priscilla Washington be called at ph# [phone] for status of letter with a full explanation of why Patient is physically unable to serve on jury duty.

## 2022-04-30 ENCOUNTER — Encounter: Payer: Self-pay | Admitting: Family Medicine

## 2022-05-01 NOTE — Telephone Encounter (Signed)
Yes that is ok.  Priscilla Washington. Jerline Pain, MD 05/01/2022 8:19 AM

## 2022-05-08 ENCOUNTER — Other Ambulatory Visit: Payer: Self-pay | Admitting: Family Medicine

## 2022-05-12 ENCOUNTER — Other Ambulatory Visit: Payer: Self-pay | Admitting: Family Medicine

## 2022-05-12 DIAGNOSIS — Z1231 Encounter for screening mammogram for malignant neoplasm of breast: Secondary | ICD-10-CM

## 2022-05-26 DIAGNOSIS — Z1231 Encounter for screening mammogram for malignant neoplasm of breast: Secondary | ICD-10-CM

## 2022-06-12 ENCOUNTER — Encounter: Payer: Self-pay | Admitting: *Deleted

## 2022-06-30 DIAGNOSIS — Z1231 Encounter for screening mammogram for malignant neoplasm of breast: Secondary | ICD-10-CM

## 2022-07-13 ENCOUNTER — Ambulatory Visit
Admission: RE | Admit: 2022-07-13 | Discharge: 2022-07-13 | Disposition: A | Discharge status: Home / Self Care | Payer: Medicare HMO | Source: Ambulatory Visit | Attending: Family Medicine | Admitting: Family Medicine

## 2022-07-13 DIAGNOSIS — Z1231 Encounter for screening mammogram for malignant neoplasm of breast: Secondary | ICD-10-CM

## 2022-08-31 ENCOUNTER — Encounter: Payer: Self-pay | Admitting: *Deleted

## 2022-11-02 DIAGNOSIS — R634 Abnormal weight loss: Secondary | ICD-10-CM | POA: Diagnosis not present

## 2022-11-02 DIAGNOSIS — Z1211 Encounter for screening for malignant neoplasm of colon: Secondary | ICD-10-CM | POA: Diagnosis not present

## 2022-11-02 DIAGNOSIS — Z800 Family history of malignant neoplasm of digestive organs: Secondary | ICD-10-CM | POA: Diagnosis not present

## 2022-11-02 DIAGNOSIS — E782 Mixed hyperlipidemia: Secondary | ICD-10-CM | POA: Diagnosis not present

## 2022-11-02 DIAGNOSIS — Z86010 Personal history of colonic polyps: Secondary | ICD-10-CM | POA: Diagnosis not present

## 2022-11-03 LAB — HEPATIC FUNCTION PANEL
ALT: 21 U/L (ref 7–35)
AST: 29 (ref 13–35)
Alkaline Phosphatase: 41 (ref 25–125)
Bilirubin, Direct: 0.15
Bilirubin, Total: 0.5

## 2022-11-03 LAB — COMPREHENSIVE METABOLIC PANEL
Albumin: 4.4 (ref 3.5–5.0)
Calcium: 10 (ref 8.7–10.7)
eGFR: 82

## 2022-11-03 LAB — BASIC METABOLIC PANEL
BUN: 15 (ref 4–21)
CO2: 23 — AB (ref 13–22)
Chloride: 105 (ref 99–108)
Creatinine: 0.8 (ref 0.5–1.1)
Glucose: 102
Potassium: 4 mEq/L (ref 3.5–5.1)
Sodium: 143 (ref 137–147)

## 2022-11-03 LAB — CBC AND DIFFERENTIAL
HCT: 41 (ref 36–46)
Hemoglobin: 13.1 (ref 12.0–16.0)
Platelets: 269 10*3/uL (ref 150–400)
WBC: 4.6

## 2022-11-03 LAB — CBC: RBC: 4.7 (ref 3.87–5.11)

## 2022-11-03 LAB — TSH: TSH: 1.13 (ref 0.41–5.90)

## 2022-11-13 ENCOUNTER — Encounter: Payer: Self-pay | Admitting: Family Medicine

## 2022-11-16 ENCOUNTER — Ambulatory Visit (INDEPENDENT_AMBULATORY_CARE_PROVIDER_SITE_OTHER): Payer: Medicare HMO

## 2022-11-16 VITALS — Wt 130.0 lb

## 2022-11-16 DIAGNOSIS — Z0000 Encounter for general adult medical examination without abnormal findings: Secondary | ICD-10-CM | POA: Diagnosis not present

## 2022-11-16 NOTE — Progress Notes (Signed)
I connected with  Jomarie Longs Gellner on 11/16/22 by a audio enabled telemedicine application and verified that I am speaking with the correct person using two identifiers.  Patient Location: Home  Provider Location: Office/Clinic  I discussed the limitations of evaluation and management by telemedicine. The patient expressed understanding and agreed to proceed.   Subjective:   Priscilla Washington is a 72 y.o. female who presents for Medicare Annual (Subsequent) preventive examination.  Review of Systems     Cardiac Risk Factors include: advanced age (>72mn, >>41women);dyslipidemia     Objective:    Today's Vitals   11/16/22 1004  Weight: 130 lb (59 kg)   Body mass index is 23.4 kg/m.     11/16/2022   10:08 AM 11/03/2021   10:19 AM 11/01/2020    2:19 PM 06/03/2020    1:55 PM  Advanced Directives  Does Patient Have a Medical Advance Directive? Yes Yes Yes No  Type of [place] Healthcare Power of [place] will   Does patient want to make changes to medical advance directive? No - Patient declined     Copy of HLos [place] Chart? Yes - validated most recent copy scanned in chart (See row information) No - copy requested No - copy requested     Current Medications (verified) Outpatient Encounter Medications as of 11/16/2022  Medication Sig   alendronate (FOSAMAX) 70 MG tablet Take 1 tablet (70 mg total) by mouth every 7 (seven) days. Take with a full glass of water on an empty stomach.   B Complex Vitamins (B COMPLEX-B12 PO) Take 1 tablet by mouth daily.   calcium citrate-vitamin D (CITRACAL+D) 315-200 MG-UNIT tablet Take 1 tablet by mouth daily.   Multiple Vitamin (MULTIVITAMIN) tablet Take 1 tablet by mouth daily.   rosuvastatin (CRESTOR) 5 MG tablet Take 1 tablet (5 mg total) by mouth daily.   Turmeric (QC TUMERIC COMPLEX PO) Tumeric   No facility-administered encounter  medications on file as of 11/16/2022.    Allergies (verified) Demerol [meperidine]   History: Past Medical History:  Diagnosis Date   Allergy    Arthritis    Cerebellar ataxia ([place]    Reported by daughter   Osteoporosis    Past Surgical History:  Procedure Laterality Date   ABDOMINAL HYSTERECTOMY Bilateral 1999,2000   Family History  Problem Relation Age of Onset   Colon cancer Mother    Heart attack Father    Colon cancer Other    Diabetes Sister    Heart disease Brother    Diabetes Sister    Stroke Sister    Heart attack Brother    Social History   Socioeconomic History   Marital status: Divorced    Spouse name: Not on file   Number of children: 2   Years of education: MBA   Highest education level: Not on file  Occupational History   Occupation: school teacher    Employer: [employer]  Tobacco Use   Smoking status: Never   Smokeless tobacco: Never  Vaping Use   Vaping Use: Never used  Substance and Sexual Activity   Alcohol use: Never   Drug use: Never   Sexual activity: Not Currently  Other Topics Concern   Not on file  Social History Narrative   Patient is a sEducation officer, 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 She lives by herself. She denies smoking, drinking.  Social Determinants of Health   Financial Resource Strain: Low Risk  (11/16/2022)   Overall Financial Resource Strain (CARDIA)    Difficulty of Paying Living Expenses: Not hard at all  Food Insecurity: No Food Insecurity (11/16/2022)   Hunger Vital Sign    Worried About Running Out of Food in the Last Year: Never true    Ran Out of Food in the Last Year: Never true  Transportation Needs: No Transportation Needs (11/16/2022)   PRAPARE - Hydrologist (Medical): No    Lack of Transportation (Non-Medical): No  Physical Activity: Insufficiently Active (11/16/2022)   Exercise Vital Sign    Days of Exercise per Week: 3  days    Minutes of Exercise per Session: 30 min  Stress: No Stress Concern Present (11/16/2022)   [place]    Feeling of Stress : Not at all  Social Connections: Moderately Integrated (11/16/2022)   Social Connection and Isolation Panel [NHANES]    Frequency of Communication with Friends and Family: More than three times a week    Frequency of Social Gatherings with Friends and Family: Twice a week    Attends Religious Services: More than 4 times per year    Active Member of [employer] or Organizations: No    Attends Music therapist: Never    Marital Status: Married    Tobacco Counseling Counseling given: Not Answered   Clinical Intake:  Pre-visit preparation completed: Yes  Pain : No/denies pain     BMI - recorded: 23.4 Nutritional Status: BMI of 19-24  Normal Nutritional Risks: None Diabetes: No  How often do you need to have someone help you when you read instructions, pamphlets, or other written materials from your doctor or pharmacy?: 1 - Never  Diabetic?no  Interpreter Needed?: No  Information entered by :: Charlott Rakes, LPN   Activities of Daily Living    11/16/2022   10:09 AM  In your present state of health, do you have any difficulty performing the following activities:  Hearing? 0  Vision? 0  Difficulty concentrating or making decisions? 0  Walking or climbing stairs? 1  Comment avoid chairs  Dressing or bathing? 0  Doing errands, shopping? 0  Preparing Food and eating ? N  Using the Toilet? N  In the past six months, have you accidently leaked urine? N  Do you have problems with loss of bowel control? N  Managing your Medications? N  Managing your Finances? N  Housekeeping or managing your Housekeeping? N    Patient Care Team: Vivi Barrack, MD as PCP - General (Family Medicine) Hoyt Koch, MD as Referring Physician (Obstetrics and Gynecology) Tat, Eustace Quail,  DO as Consulting Physician (Neurology) Juanita Craver, MD as Consulting Physician (Gastroenterology)  Indicate any recent Medical Services you may have received from other than [facility] providers in the past year (date may be approximate).     Assessment:   This is a routine wellness examination for Priscilla Washington.  Hearing/Vision screen Hearing Screening - Comments:: Denies any hearing issues  Vision Screening - Comments:: [facility]   Dietary issues and exercise activities discussed: Current Exercise Habits: Home exercise routine, Type of exercise: stretching;strength training/weights, Time (Minutes): 30, Frequency (Times/Week): 3, Weekly Exercise (Minutes/Week): 90   Goals Addressed             This Visit's Progress    Patient Stated  Start walking without walker        Depression Screen    11/16/2022   10:07 AM 11/03/2021   10:18 AM 03/28/2021    2:42 PM 11/29/2020   10:28 AM 11/01/2020    2:22 PM 04/19/2020    1:13 PM  PHQ 2/9 Scores  PHQ - 2 Score 0 0 0 0 0 0    Fall Risk    11/16/2022   10:09 AM 11/03/2021   10:20 AM 03/28/2021    2:42 PM 11/29/2020   10:28 AM 11/01/2020    2:20 PM  Fall Risk   Falls in the past year? 1 0 0 1 1  Number falls in past yr: 1 0 0 0 0  Injury with Fall? 0 0 0  0  Risk for fall due to : Impaired balance/gait;Impaired mobility;Impaired vision;History of fall(s) Impaired vision;Impaired balance/gait;Impaired mobility Impaired balance/gait  History of fall(s)  Follow up Falls prevention discussed Falls prevention discussed   Falls prevention discussed    FALL RISK PREVENTION PERTAINING TO THE HOME:  Any stairs in or around the home? No  If so, are there any without handrails? No  Home free of loose throw rugs in walkways, pet beds, electrical cords, etc? Yes  Adequate lighting in your home to reduce risk of falls? Yes   ASSISTIVE DEVICES UTILIZED TO PREVENT FALLS:  Life alert? Yes  Use of a cane, walker or w/c? Yes   Grab bars in the bathroom? Yes  Shower chair or bench in shower? Yes  Elevated toilet seat or a handicapped toilet? No   TIMED UP AND GO:  Was the test performed? No .   Cognitive Function:        11/16/2022   10:10 AM 11/03/2021   10:21 AM  6CIT Screen  What Year? 0 points 0 points  What month? 0 points 0 points  What time? 0 points 0 points  Count back from 20 0 points 0 points  Months in reverse 0 points 0 points  Repeat phrase 0 points 4 points  Total Score 0 points 4 points    Immunizations Immunization History  Administered Date(s) Administered   Moderna SARS-COV2 Booster Vaccination 07/22/2020   Moderna Sars-Covid-2 Vaccination 11/10/2019, 12/16/2019, 07/22/2020, 08/10/2021   PNEUMOCOCCAL CONJUGATE-20 12/08/2021      Flu Vaccine status: Declined, Education has been provided regarding the importance of this vaccine but patient still declined. Advised may receive this vaccine at local pharmacy or Health Dept. Aware to provide a copy of the vaccination record if obtained from local pharmacy or Health Dept. Verbalized acceptance and understanding.  Pneumococcal vaccine status: Up to date  Covid-19 vaccine status: Completed vaccines  Qualifies for Shingles Vaccine? Yes   Zostavax completed No   Shingrix Completed?: No.    Education has been provided regarding the importance of this vaccine. Patient has been advised to call insurance company to determine out of pocket expense if they have not yet received this vaccine. Advised may also receive vaccine at local pharmacy or Health Dept. Verbalized acceptance and understanding.  Screening Tests Health Maintenance  Topic Date Due   Hepatitis C Screening  Never done   Zoster Vaccines- Shingrix (1 of 2) Never done   INFLUENZA VACCINE  Never done   COVID-19 Vaccine (6 - 2023-24 season) 05/19/2022   DEXA SCAN  10/06/2023   Medicare Annual Wellness (AWV)  11/16/2023   MAMMOGRAM  07/13/2024   COLONOSCOPY (Pts 45-74yr  Insurance coverage will need to be  confirmed)  09/28/2027   Pneumonia Vaccine 67+ Years old  Completed   HPV VACCINES  Aged Out   DTaP/Tdap/Td  Discontinued    Health Maintenance  Health Maintenance Due  Topic Date Due   Hepatitis C Screening  Never done   Zoster Vaccines- Shingrix (1 of 2) Never done   INFLUENZA VACCINE  Never done   COVID-19 Vaccine (6 - 2023-24 season) 05/19/2022    Colorectal cancer screening: Type of screening: Colonoscopy. Completed 09/27/17. Repeat every 10 years  Mammogram status: Completed 07/13/22. Repeat every year  Bone Density status: Completed 10/05/21. Results reflect: Bone density results: OSTEOPOROSIS. Repeat every 2 years.    Additional Screening:  Hepatitis C Screening: does qualify;   Vision Screening: Recommended annual ophthalmology exams for early detection of glaucoma and other disorders of the eye. Is the patient up to date with their annual eye exam?  Yes  Who is the provider or what is the name of the office in which the patient attends annual eye exams? [facility]  If pt is not established with a provider, would they like to be referred to a provider to establish care? No .   Dental Screening: Recommended annual dental exams for proper oral hygiene  Community Resource Referral / Chronic Care Management: CRR required this visit?  No   CCM required this visit?  No      Plan:     I have personally reviewed and noted the following in the patient's chart:   Medical and social history Use of alcohol, tobacco or illicit drugs  Current medications and supplements including opioid prescriptions. Patient is not currently taking opioid prescriptions. Functional ability and status Nutritional status Physical activity Advanced directives List of other physicians Hospitalizations, surgeries, and ER visits in previous 12 months Vitals Screenings to include cognitive, depression, and falls Referrals and appointments  In  addition, I have reviewed and discussed with patient certain preventive protocols, quality metrics, and best practice recommendations. A written personalized care plan for preventive services as well as general preventive health recommendations were provided to patient.     Willette Brace, LPN   [id number]   Nurse Notes: none

## 2022-11-16 NOTE — Patient Instructions (Signed)
[name] , Thank you for taking time to come for your Medicare Wellness Visit. I appreciate your ongoing commitment to your health goals. Please review the following plan we discussed and let me know if I can assist you in the future.   These are the goals we discussed:  Goals      Patient Stated     Maintain current health & activity level     Patient Stated     To be able to walk without walker      Patient Stated     Start walking without walker         This is a list of the screening recommended for you and due dates:  Health Maintenance  Topic Date Due   Hepatitis C Screening: USPSTF Recommendation to screen - Ages 72-79 yo.  Never done   Zoster (Shingles) Vaccine (1 of 2) Never done   Flu Shot  Never done   COVID-19 Vaccine (6 - 2023-24 season) 05/19/2022   DEXA scan (bone density measurement)  10/06/2023   Medicare Annual Wellness Visit  11/16/2023   Mammogram  07/13/2024   Colon Cancer Screening  09/28/2027   Pneumonia Vaccine  Completed   HPV Vaccine  Aged Out   DTaP/Tdap/Td vaccine  Discontinued    Advanced directives: copies in chart  Conditions/risks identified: get back to walking without walker   Next appointment: Follow up in one year for your annual wellness visit    Preventive Care 65 Years and Older, Female Preventive care refers to lifestyle choices and visits with your health care provider that can promote health and wellness. What does preventive care include? A yearly physical exam. This is also called an annual well check. Dental exams once or twice a year. Routine eye exams. Ask your health care provider how often you should have your eyes checked. Personal lifestyle choices, including: Daily care of your teeth and gums. Regular physical activity. Eating a healthy diet. Avoiding tobacco and drug use. Limiting alcohol use. Practicing safe sex. Taking low-dose aspirin every day. Taking vitamin and mineral supplements as recommended by  your health care provider. What happens during an annual well check? The services and screenings done by your health care provider during your annual well check will depend on your age, overall health, lifestyle risk factors, and family history of disease. Counseling  Your health care provider may ask you questions about your: Alcohol use. Tobacco use. Drug use. Emotional well-being. Home and relationship well-being. Sexual activity. Eating habits. History of falls. Memory and ability to understand (cognition). Work and work Statistician. Reproductive health. Screening  You may have the following tests or measurements: Height, weight, and BMI. Blood pressure. Lipid and cholesterol levels. These may be checked every 5 years, or more frequently if you are over 68 years old. Skin check. Lung cancer screening. You may have this screening every year starting at age 78 if you have a 30-pack-year history of smoking and currently smoke or have quit within the past 15 years. Fecal occult blood test (FOBT) of the stool. You may have this test every year starting at age 15. Flexible sigmoidoscopy or colonoscopy. You may have a sigmoidoscopy every 5 years or a colonoscopy every 10 years starting at age 3. Hepatitis C blood test. Hepatitis B blood test. Sexually transmitted disease (STD) testing. Diabetes screening. This is done by checking your blood sugar (glucose) after you have not eaten for a while (fasting). You may have this done every 1-3  years. Bone density scan. This is done to screen for osteoporosis. You may have this done starting at age 27. Mammogram. This may be done every 1-2 years. Talk to your health care provider about how often you should have regular mammograms. Talk with your health care provider about your test results, treatment options, and if necessary, the need for more tests. Vaccines  Your health care provider may recommend certain vaccines, such as: Influenza  vaccine. This is recommended every year. Tetanus, diphtheria, and acellular pertussis (Tdap, Td) vaccine. You may need a Td booster every 10 years. Zoster vaccine. You may need this after age 28. Pneumococcal 13-valent conjugate (PCV13) vaccine. One dose is recommended after age 15. Pneumococcal polysaccharide (PPSV23) vaccine. One dose is recommended after age 90. Talk to your health care provider about which screenings and vaccines you need and how often you need them. This information is not intended to replace advice given to you by your health care provider. Make sure you discuss any questions you have with your health care provider. Document Released: 10/01/2015 Document Revised: 05/24/2016 Document Reviewed: 07/06/2015 Elsevier Interactive Patient Education  2017 [employer] Prevention in the Home Falls can cause injuries. They can happen to people of all ages. There are many things you can do to make your home safe and to help prevent falls. What can I do on the outside of my home? Regularly fix the edges of walkways and driveways and fix any cracks. Remove anything that might make you trip as you walk through a door, such as a raised step or threshold. Trim any bushes or trees on the path to your home. Use bright outdoor lighting. Clear any walking paths of anything that might make someone trip, such as rocks or tools. Regularly check to see if handrails are loose or broken. Make sure that both sides of any steps have handrails. Any raised decks and porches should have guardrails on the edges. Have any leaves, snow, or ice cleared regularly. Use sand or salt on walking paths during winter. Clean up any spills in your garage right away. This includes oil or grease spills. What can I do in the bathroom? Use night lights. Install grab bars by the toilet and in the tub and shower. Do not use towel bars as grab bars. Use non-skid mats or decals in the tub or shower. If you  need to sit down in the shower, use a plastic, non-slip stool. Keep the floor dry. Clean up any water that spills on the floor as soon as it happens. Remove soap buildup in the tub or shower regularly. Attach bath mats securely with double-sided non-slip rug tape. Do not have throw rugs and other things on the floor that can make you trip. What can I do in the bedroom? Use night lights. Make sure that you have a light by your bed that is easy to reach. Do not use any sheets or blankets that are too big for your bed. They should not hang down onto the floor. Have a firm chair that has side arms. You can use this for support while you get dressed. Do not have throw rugs and other things on the floor that can make you trip. What can I do in the kitchen? Clean up any spills right away. Avoid walking on wet floors. Keep items that you use a lot in easy-to-reach places. If you need to reach something above you, use a strong step stool that has a grab  bar. Keep electrical cords out of the way. Do not use floor polish or wax that makes floors slippery. If you must use wax, use non-skid floor wax. Do not have throw rugs and other things on the floor that can make you trip. What can I do with my stairs? Do not leave any items on the stairs. Make sure that there are handrails on both sides of the stairs and use them. Fix handrails that are broken or loose. Make sure that handrails are as long as the stairways. Check any carpeting to make sure that it is firmly attached to the stairs. Fix any carpet that is loose or worn. Avoid having throw rugs at the top or bottom of the stairs. If you do have throw rugs, attach them to the floor with carpet tape. Make sure that you have a light switch at the top of the stairs and the bottom of the stairs. If you do not have them, ask someone to add them for you. What else can I do to help prevent falls? Wear shoes that: Do not have high heels. Have rubber  bottoms. Are comfortable and fit you well. Are closed at the toe. Do not wear sandals. If you use a stepladder: Make sure that it is fully opened. Do not climb a closed stepladder. Make sure that both sides of the stepladder are locked into place. Ask someone to hold it for you, if possible. Clearly mark and make sure that you can see: Any grab bars or handrails. First and last steps. Where the edge of each step is. Use tools that help you move around (mobility aids) if they are needed. These include: Canes. Walkers. Scooters. Crutches. Turn on the lights when you go into a dark area. Replace any light bulbs as soon as they burn out. Set up your furniture so you have a clear path. Avoid moving your furniture around. If any of your floors are uneven, fix them. If there are any pets around you, be aware of where they are. Review your medicines with your doctor. Some medicines can make you feel dizzy. This can increase your chance of falling. Ask your doctor what other things that you can do to help prevent falls. This information is not intended to replace advice given to you by your health care provider. Make sure you discuss any questions you have with your health care provider. Document Released: 07/01/2009 Document Revised: 02/10/2016 Document Reviewed: 10/09/2014 Elsevier Interactive Patient Education  2017 [employer].

## 2022-11-28 ENCOUNTER — Other Ambulatory Visit: Payer: Self-pay | Admitting: Family Medicine

## 2022-12-19 ENCOUNTER — Other Ambulatory Visit: Payer: Self-pay | Admitting: Family Medicine

## 2023-01-09 ENCOUNTER — Other Ambulatory Visit: Payer: Self-pay | Admitting: Family Medicine

## 2023-01-09 NOTE — Telephone Encounter (Signed)
REQUEST FOR 90 DAYS PRESCRIPTION

## 2023-01-12 DIAGNOSIS — Z09 Encounter for follow-up examination after completed treatment for conditions other than malignant neoplasm: Secondary | ICD-10-CM | POA: Diagnosis not present

## 2023-01-12 DIAGNOSIS — Z800 Family history of malignant neoplasm of digestive organs: Secondary | ICD-10-CM | POA: Diagnosis not present

## 2023-01-12 DIAGNOSIS — Z86010 Personal history of colonic polyps: Secondary | ICD-10-CM | POA: Diagnosis not present

## 2023-01-12 DIAGNOSIS — Z1211 Encounter for screening for malignant neoplasm of colon: Secondary | ICD-10-CM | POA: Diagnosis not present

## 2023-01-12 LAB — HM COLONOSCOPY

## 2023-01-15 ENCOUNTER — Encounter: Payer: Self-pay | Admitting: Family Medicine

## 2023-01-18 ENCOUNTER — Other Ambulatory Visit: Payer: Self-pay | Admitting: Family Medicine

## 2023-01-29 ENCOUNTER — Encounter: Payer: Self-pay | Admitting: Family Medicine

## 2023-01-29 ENCOUNTER — Other Ambulatory Visit: Payer: Self-pay | Admitting: *Deleted

## 2023-01-29 ENCOUNTER — Other Ambulatory Visit: Payer: Self-pay | Admitting: Family Medicine

## 2023-01-29 MED ORDER — ALENDRONATE SODIUM 70 MG PO TABS: ORAL_TABLET | ORAL | 0 refills | Status: DC

## 2023-02-15 ENCOUNTER — Encounter: Payer: Self-pay | Admitting: Family Medicine

## 2023-02-15 ENCOUNTER — Ambulatory Visit (INDEPENDENT_AMBULATORY_CARE_PROVIDER_SITE_OTHER): Payer: Medicare HMO | Admitting: Family Medicine

## 2023-02-15 VITALS — BP 163/93 | HR 76 | Temp 97.3°F | Ht 62.5 in | Wt 132.4 lb

## 2023-02-15 DIAGNOSIS — M810 Age-related osteoporosis without current pathological fracture: Secondary | ICD-10-CM | POA: Diagnosis not present

## 2023-02-15 DIAGNOSIS — Z1159 Encounter for screening for other viral diseases: Secondary | ICD-10-CM

## 2023-02-15 DIAGNOSIS — Z0001 Encounter for general adult medical examination with abnormal findings: Secondary | ICD-10-CM | POA: Diagnosis not present

## 2023-02-15 DIAGNOSIS — R030 Elevated blood-pressure reading, without diagnosis of hypertension: Secondary | ICD-10-CM

## 2023-02-15 DIAGNOSIS — Z131 Encounter for screening for diabetes mellitus: Secondary | ICD-10-CM | POA: Diagnosis not present

## 2023-02-15 DIAGNOSIS — E782 Mixed hyperlipidemia: Secondary | ICD-10-CM

## 2023-02-15 LAB — LIPID PANEL
Cholesterol: 169 mg/dL (ref 0–200)
HDL: 80.1 mg/dL (ref >39.00)
LDL Cholesterol: 79 mg/dL (ref 0–99)
NonHDL: 88.42
Total CHOL/HDL Ratio: 2
Triglycerides: 46 mg/dL (ref 0.0–149.0)
VLDL: 9.2 mg/dL (ref 0.0–40.0)

## 2023-02-15 LAB — TSH: TSH: 0.88 u[IU]/mL (ref 0.35–5.50)

## 2023-02-15 LAB — COMPREHENSIVE METABOLIC PANEL
ALT: 21 U/L (ref 0–35)
AST: 28 U/L (ref 0–37)
Albumin: 4.4 g/dL (ref 3.5–5.2)
Alkaline Phosphatase: 40 U/L (ref 39–117)
BUN: 14 mg/dL (ref 6–23)
CO2: 29 mEq/L (ref 19–32)
Calcium: 9.5 mg/dL (ref 8.4–10.5)
Chloride: 105 mEq/L (ref 96–112)
Creatinine, Ser: 0.73 mg/dL (ref 0.40–1.20)
GFR: 83.3 mL/min (ref >60.00)
Glucose, Bld: 89 mg/dL (ref 70–99)
Potassium: 3.8 mEq/L (ref 3.5–5.1)
Sodium: 143 mEq/L (ref 135–145)
Total Bilirubin: 0.6 mg/dL (ref 0.2–1.2)
Total Protein: 7.2 g/dL (ref 6.0–8.3)

## 2023-02-15 LAB — CBC
HCT: 39.1 % (ref 36.0–46.0)
Hemoglobin: 12.5 g/dL (ref 12.0–15.0)
MCHC: 31.9 g/dL (ref 30.0–36.0)
MCV: 86.6 fl (ref 78.0–100.0)
Platelets: 211 10*3/uL (ref 150.0–400.0)
RBC: 4.52 Mil/uL (ref 3.87–5.11)
RDW: 15.4 % (ref 11.5–15.5)
WBC: 4.1 10*3/uL (ref 4.0–10.5)

## 2023-02-15 LAB — HEMOGLOBIN A1C: Hgb A1c MFr Bld: 6.1 % (ref 4.6–6.5)

## 2023-02-15 MED ORDER — ROSUVASTATIN CALCIUM 5 MG PO TABS: 5.0000 mg | ORAL_TABLET | Freq: Every day | ORAL | 4 refills | Status: DC

## 2023-02-15 MED ORDER — ALENDRONATE SODIUM 70 MG PO TABS: ORAL_TABLET | ORAL | 4 refills | Status: DC

## 2023-02-15 NOTE — Assessment & Plan Note (Signed)
Stable on Fosamax 70 mg weekly.  Repeat DEXA next year.

## 2023-02-15 NOTE — Assessment & Plan Note (Signed)
Elevated today.  Home readings have been all well-controlled.  They will continue to monitor at home and let us know if persistently elevated.

## 2023-02-15 NOTE — Progress Notes (Signed)
Chief Complaint:  Priscilla Washington is a 72 y.o. female who presents today for her annual comprehensive physical exam.    Assessment/Plan:  Chronic Problems Addressed Today: Osteoporosis (T score -3 DEXA 09/2021) Stable on Fosamax 70 mg weekly.  Repeat DEXA next year.  Dyslipidemia On Crestor 5 mg daily.  Discussed lifestyle modifications.  Check lipids.  White coat syndrome without hypertension Elevated today.  Home readings have been all well-controlled.  They will continue to monitor at home and let us know if persistently elevated.  Preventative Healthcare: Check labs.  Colonoscopy last month was normal.  Due for mammogram later this year.  Repeat DEXA next year.  Patient Counseling(The following topics were reviewed and/or handout was given):  -Nutrition: Stressed importance of moderation in sodium/caffeine intake, saturated fat and cholesterol, caloric balance, sufficient intake of fresh fruits, vegetables, and fiber.  -Stressed the importance of regular exercise.   -Substance Abuse: Discussed cessation/primary prevention of tobacco, alcohol, or other drug use; driving or other dangerous activities under the influence; availability of treatment for abuse.   -Injury prevention: Discussed safety belts, safety helmets, smoke detector, smoking near bedding or upholstery.   -Sexuality: Discussed sexually transmitted diseases, partner selection, use of condoms, avoidance of unintended pregnancy and contraceptive alternatives.   -Dental health: Discussed importance of regular tooth brushing, flossing, and dental visits.  -Health maintenance and immunizations reviewed. Please refer to Health maintenance section.  Return to care in 1 year for next preventative visit.     Subjective:  HPI:  She has no acute complaints today. See Assessment / plan for status of chronic conditions.   Lifestyle Diet: Balanced of plenty of fruits and vegetables.  Exercise: Work out through zoom      02/15/2023    1:06 PM  Depression screen PHQ 2/9  Decreased Interest 0  Down, Depressed, Hopeless 0  PHQ - 2 Score 0    Health Maintenance Due  Topic Date Due   Hepatitis C Screening  Never done     ROS: Per HPI, otherwise a complete review of systems was negative.   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     calcium citrate-vitamin D (CITRACAL+D) 315-200 MG-UNIT tablet Take 1 tablet by mouth daily.     Multiple Vitamin (MULTIVITAMIN) tablet Take 1 tablet by mouth daily.     Turmeric (QC TUMERIC COMPLEX PO) Tumeric     alendronate (FOSAMAX) 70 MG tablet TAKE 1 TABLET (  70 MG TOTAL) BY MOUTH EVERY 7 DAYS WITH FULL GLASS WATER ON EMPTY STOMACH 12 tablet 4   rosuvastatin (CRESTOR) 5 MG tablet Take 1 tablet (5 mg total) by mouth daily. 90 tablet 4   No current facility-administered medications for this visit.    Allergies-reviewed and updated Allergies  Allergen Reactions   Demerol [Meperidine]     Social History   Socioeconomic History   Marital  status: Divorced    Spouse name: Not on file   Number of children: 2   Years of education: MBA   Highest education level: Not on file  Occupational History   Occupation: school teacher    Employer: [employer]  Tobacco Use   Smoking status: Never   Smokeless tobacco: Never  Vaping Use   Vaping Use: Never used  Substance and Sexual Activity   Alcohol use: Never   Drug use: Never   Sexual activity: Not Currently  Other Topics Concern   Not on file  Social History Narrative   Patient is a Engineer, 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 She lives by herself. She denies smoking, drinking.   Social Determinants of Health   Financial Resource Strain: Low Risk  (11/16/2022)   Overall Financial Resource Strain (CARDIA)    Difficulty of Paying Living Expenses: Not hard at all  Food Insecurity: No Food Insecurity (11/16/2022)   Hunger Vital Sign    Worried About Running Out of Food in the Last Year: Never true    Ran Out of Food in the Last Year: Never true  Transportation Needs: No Transportation Needs (11/16/2022)   PRAPARE - Administrator, Civil Service (Medical): No    Lack of Transportation (Non-Medical): No  Physical Activity: Insufficiently Active (11/16/2022)   Exercise Vital Sign    Days of Exercise per Week: 3 days    Minutes of Exercise per Session: 30 min  Stress: No Stress Concern Present (11/16/2022)   [employer] of Occupational Health - Occupational Stress Questionnaire    Feeling of Stress : Not at all  Social Connections: Moderately Integrated (11/16/2022)   Social Connection and Isolation Panel [NHANES]    Frequency of Communication with Friends and Family: More than three times a week    Frequency of Social Gatherings with Friends and Family: Twice a week    Attends Religious Services: More than 4 times per year    Active Member of [employer] or Organizations: No    Attends Hospital doctor: Never    Marital Status: Married        Objective:  Physical Exam: BP (!) 163/93   Pulse 76   Temp (!) 97.3 F (36.3 C) (Temporal)   Ht 5' 2.5" (1.588 m)   Wt 132 lb 6.4 oz (60.1 kg)   SpO2 100%   BMI 23.83 kg/m   Body mass index is 23.83 kg/m. Wt Readings from Last 3 Encounters:  02/15/23 132 lb 6.4 oz (60.1 kg)  11/16/22 130 lb (59 kg)  12/08/21 141 lb 9.6 oz (64.2 kg)   Gen: NAD, resting comfortably HEENT: TMs normal bilaterally. OP clear. No thyromegaly noted.  CV: RRR with no murmurs appreciated Pulm: NWOB, CTAB with no crackles, wheezes, or rhonchi GI: Normal bowel sounds present. Soft, Nontender, Nondistended. MSK: no edema, cyanosis, or clubbing noted Skin: warm, dry Neuro: CN2-12 grossly intact. Strength 5/5 in upper and lower extremities. Reflexes symmetric and intact bilaterally.  Psych: Normal affect  and thought content     Priscilla Sheard M. Jimmey Ralph, MD 02/15/2023 2:04 PM

## 2023-02-15 NOTE — Assessment & Plan Note (Signed)
On Crestor 5 mg daily.  Discussed lifestyle modifications.  Check lipids.

## 2023-02-15 NOTE — Patient Instructions (Addendum)
It was very nice to see you today!  We will check blood work today.  Please monitor blood pressure and let us know if it is persistently elevated.  Return in about 1 year (around 02/15/2024) for Annual Physical.   Take care, Dr Jimmey Ralph  PLEASE NOTE:  If you had any lab tests, please let us know if you have not heard back within a few days. You may see your results on mychart before we have a chance to review them but we will give you a call once they are reviewed by [place].   If we ordered any referrals today, please let us know if you have not heard from their office within the next week.   If you had any urgent prescriptions sent in today, please check with the pharmacy within an hour of our visit to make sure the prescription was transmitted appropriately.   Please try these tips to maintain a healthy lifestyle:  Eat at least 3 REAL meals and 1-2 snacks per day.  Aim for no more than 5 hours between eating.  If you eat breakfast, please do so within one hour of getting up.   Each meal should contain half fruits/vegetables, one quarter protein, and one quarter carbs (no bigger than a computer mouse)  Cut down on sweet beverages. This includes juice, soda, and sweet tea.   Drink at least 1 glass of water with each meal and aim for at least 8 glasses per day  Exercise at least 150 minutes every week.    Preventive Care 34 Years and Older, Female Preventive care refers to lifestyle choices and visits with your health care provider that can promote health and wellness. Preventive care visits are also called wellness exams. What can I expect for my preventive care visit? Counseling Your health care provider may ask you questions about your: Medical history, including: Past medical problems. Family medical history. Pregnancy and menstrual history. History of falls. Current health, including: Memory and ability to understand (cognition). Emotional well-being. Home life and  relationship well-being. Sexual activity and sexual health. Lifestyle, including: Alcohol, nicotine or tobacco, and drug use. Access to firearms. Diet, exercise, and sleep habits. Work and work Astronomer. Sunscreen use. Safety issues such as seatbelt and bike helmet use. Physical exam Your health care provider will check your: Height and weight. These may be used to calculate your BMI (body mass index). BMI is a measurement that tells if you are at a healthy weight. Waist circumference. This measures the distance around your waistline. This measurement also tells if you are at a healthy weight and may help predict your risk of certain diseases, such as type 2 diabetes and high blood pressure. Heart rate and blood pressure. Body temperature. Skin for abnormal spots. What immunizations do I need?  Vaccines are usually given at various ages, according to a schedule. Your health care provider will recommend vaccines for you based on your age, medical history, and lifestyle or other factors, such as travel or where you work. What tests do I need? Screening Your health care provider may recommend screening tests for certain conditions. This may include: Lipid and cholesterol levels. Hepatitis C test. Hepatitis B test. HIV (human immunodeficiency virus) test. STI (sexually transmitted infection) testing, if you are at risk. Lung cancer screening. Colorectal cancer screening. Diabetes screening. This is done by checking your blood sugar (glucose) after you have not eaten for a while (fasting). Mammogram. Talk with your health care provider about how often you  should have regular mammograms. BRCA-related cancer screening. This may be done if you have a family history of breast, ovarian, tubal, or peritoneal cancers. Bone density scan. This is done to screen for osteoporosis. Talk with your health care provider about your test results, treatment options, and if necessary, the need for more  tests. Follow these instructions at home: Eating and drinking  Eat a diet that includes fresh fruits and vegetables, whole grains, lean protein, and low-fat dairy products. Limit your intake of foods with high amounts of sugar, saturated fats, and salt. Take vitamin and mineral supplements as recommended by your health care provider. Do not drink alcohol if your health care provider tells you not to drink. If you drink alcohol: Limit how much you have to 0-1 drink a day. Know how much alcohol is in your drink. In the [place] one drink equals one 12 oz bottle of beer (355 mL), one 5 oz glass of wine (148 mL), or one 1 oz glass of hard liquor (44 mL). Lifestyle Brush your teeth every morning and night with fluoride toothpaste. Floss one time each day. Exercise for at least 30 minutes 5 or more days each week. Do not use any products that contain nicotine or tobacco. These products include cigarettes, chewing tobacco, and vaping devices, such as e-cigarettes. If you need help quitting, ask your health care provider. Do not use drugs. If you are sexually active, practice safe sex. Use a condom or other form of protection in order to prevent STIs. Take aspirin only as told by your health care provider. Make sure that you understand how much to take and what form to take. Work with your health care provider to find out whether it is safe and beneficial for you to take aspirin daily. Ask your health care provider if you need to take a cholesterol-lowering medicine (statin). Find healthy ways to manage stress, such as: Meditation, yoga, or listening to music. Journaling. Talking to a trusted person. Spending time with friends and family. Minimize exposure to UV radiation to reduce your risk of skin cancer. Safety Always wear your seat belt while driving or riding in a vehicle. Do not drive: If you have been drinking alcohol. Do not ride with someone who has been drinking. When you are tired or  distracted. While texting. If you have been using any mind-altering substances or drugs. Wear a helmet and other protective equipment during sports activities. If you have firearms in your house, make sure you follow all gun safety procedures. What's next? Visit your health care provider once a year for an annual wellness visit. Ask your health care provider how often you should have your eyes and teeth checked. Stay up to date on all vaccines. This information is not intended to replace advice given to you by your health care provider. Make sure you discuss any questions you have with your health care provider. Document Revised: 03/02/2021 Document Reviewed: 03/02/2021 Elsevier Patient Education  2024 [employer]. [

## 2023-02-16 LAB — HEPATITIS C ANTIBODY: Hepatitis C Ab: NONREACTIVE

## 2023-02-19 NOTE — Progress Notes (Signed)
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  Do not need to start meds for this but she should continue to work on diet and exercise and we can recheck in a year.  Everything else is normal.

## 2023-03-12 ENCOUNTER — Encounter: Payer: Self-pay | Admitting: Family Medicine

## 2023-06-21 ENCOUNTER — Other Ambulatory Visit: Payer: Self-pay | Admitting: Family Medicine

## 2023-06-21 DIAGNOSIS — Z1231 Encounter for screening mammogram for malignant neoplasm of breast: Secondary | ICD-10-CM

## 2023-07-18 ENCOUNTER — Ambulatory Visit
Admission: RE | Admit: 2023-07-18 | Discharge: 2023-07-18 | Disposition: A | Discharge status: Home / Self Care | Payer: Medicare HMO | Source: Ambulatory Visit

## 2023-07-18 DIAGNOSIS — Z1231 Encounter for screening mammogram for malignant neoplasm of breast: Secondary | ICD-10-CM

## 2023-11-26 ENCOUNTER — Ambulatory Visit (INDEPENDENT_AMBULATORY_CARE_PROVIDER_SITE_OTHER): Payer: Medicare HMO

## 2023-11-26 VITALS — Ht 62.0 in | Wt 132.0 lb

## 2023-11-26 DIAGNOSIS — Z0000 Encounter for general adult medical examination without abnormal findings: Secondary | ICD-10-CM | POA: Diagnosis not present

## 2023-11-26 NOTE — Patient Instructions (Signed)
 Priscilla Washington , Thank you for taking time to come for your Medicare Wellness Visit. I appreciate your ongoing commitment to your health goals. Please review the following plan we discussed and let me know if I can assist you in the future.   Referrals/Orders/Follow-Ups/Clinician Recommendations: Each day, aim for 6 glasses of water, plenty of protein in your diet and try to get up and walk/ stretch every hour for 5-10 minutes at a time.    This is a list of the screening recommended for you and due dates:  Health Maintenance  Topic Date Due   Zoster (Shingles) Vaccine (1 of 2) Never done   COVID-19 Vaccine (6 - 2024-25 season) 05/20/2023   DEXA scan (bone density measurement)  10/06/2023   Flu Shot  12/17/2023*   Medicare Annual Wellness Visit  11/25/2024   Mammogram  07/17/2025   Colon Cancer Screening  01/11/2033   Pneumonia Vaccine  Completed   Hepatitis C Screening  Completed   HPV Vaccine  Aged Out   DTaP/Tdap/Td vaccine  Discontinued  *Topic was postponed. The date shown is not the original due date.    Advanced directives: (Copy Requested) Please bring a copy of your health care power of attorney and living will to the office to be added to your chart at your convenience. You can mail to [facility] [address] [place], [place] [address] or email to [email or web address]  Next Medicare Annual Wellness Visit scheduled for next year: Yes

## 2023-11-26 NOTE — Progress Notes (Signed)
 Subjective:   Priscilla Washington is a 73 y.o. who presents for a Medicare Wellness preventive visit.  Visit Complete: Virtual I connected with  Priscilla Washington on 11/26/23 by a audio enabled telemedicine application and verified that I am speaking with the correct person using two identifiers.  Patient Location: Home  Provider Location: Office/Clinic  I discussed the limitations of evaluation and management by telemedicine. The patient expressed understanding and agreed to proceed.  Vital Signs: Because this visit was a virtual/telehealth visit, some criteria may be missing or patient reported. Any vitals not documented were not able to be obtained and vitals that have been documented are patient reported.  VideoDeclined- This patient declined Librarian, academic. Therefore the visit was completed with audio only.  AWV Questionnaire: No: Patient Medicare AWV questionnaire was not completed prior to this visit.  Cardiac Risk Factors include: advanced age (>68men, >15 women);dyslipidemia     Objective:    Today's Vitals   11/26/23 1046  Weight: 132 lb (59.9 kg)  Height: 5\' 2"  (1.575 m)   Body mass index is 24.14 kg/m.     11/26/2023   10:49 AM 11/16/2022   10:08 AM 11/03/2021   10:19 AM 11/01/2020    2:19 PM 06/03/2020    1:55 PM  Advanced Directives  Does Patient Have a Medical Advance Directive? Yes Yes Yes Yes No  Type of Estate agent of [place];Living will Healthcare Power of [place];Living will Healthcare Power of [employer] of [place];Living will   Does patient want to make changes to medical advance directive?  No - Patient declined     Copy of Healthcare Power of Attorney in Chart? No - copy requested Yes - validated most recent copy scanned in chart (See row information) No - copy requested No - copy requested     Current Medications (verified) Outpatient Encounter Medications as of 11/26/2023   Medication Sig   alendronate (FOSAMAX) 70 MG tablet TAKE 1 TABLET (70 MG TOTAL) BY MOUTH EVERY 7 DAYS WITH FULL GLASS WATER ON EMPTY STOMACH   B Complex Vitamins (B COMPLEX-B12 PO) Take 1 tablet by mouth daily.   calcium citrate-vitamin D (CITRACAL+D) 315-200 MG-UNIT tablet Take 1 tablet by mouth daily.   ELDERBERRY PO Take by mouth.   Multiple Vitamin (MULTIVITAMIN) tablet Take 1 tablet by mouth daily.   rosuvastatin (CRESTOR) 5 MG tablet Take 1 tablet (5 mg total) by mouth daily.   Turmeric (QC TUMERIC COMPLEX PO) Tumeric   No facility-administered encounter medications on file as of 11/26/2023.    Allergies (verified) Demerol [meperidine]   History: Past Medical History:  Diagnosis Date   Allergy    Arthritis    Cerebellar ataxia (HCC)    Reported by daughter   Osteoporosis    Past Surgical History:  Procedure Laterality Date   ABDOMINAL HYSTERECTOMY Bilateral 1999,2000   Family History  Problem Relation Age of Onset   Colon cancer Mother    Heart attack Father    Colon cancer Other    Diabetes Sister    Heart disease Brother    Diabetes Sister    Stroke Sister    Heart attack Brother    Social History   Socioeconomic History   Marital status: Divorced    Spouse name: Not on file   Number of children: 2   Years of education: MBA   Highest education level: Not on file  Occupational History   Occupation: school Runner, broadcasting/film/video  Employer: [employer]  Tobacco Use   Smoking status: Never   Smokeless tobacco: Never  Vaping Use   Vaping status: Never Used  Substance and Sexual Activity   Alcohol use: Never   Drug use: Never   Sexual activity: Not Currently  Other Topics Concern   Not on file  Social History Narrative   Patient is a Engineer, 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 She lives by herself. She denies smoking, drinking.   Social Drivers of Corporate investment banker Strain: Low Risk  (11/26/2023)    Overall Financial Resource Strain (CARDIA)    Difficulty of Paying Living Expenses: Not hard at all  Food Insecurity: No Food Insecurity (11/26/2023)   Hunger Vital Sign    Worried About Running Out of Food in the Last Year: Never true    Ran Out of Food in the Last Year: Never true  Transportation Needs: No Transportation Needs (11/26/2023)   PRAPARE - Administrator, Civil Service (Medical): No    Lack of Transportation (Non-Medical): No  Physical Activity: Insufficiently Active (11/26/2023)   Exercise Vital Sign    Days of Exercise per Week: 3 days    Minutes of Exercise per Session: 30 min  Stress: No Stress Concern Present (11/26/2023)   [employer] of Occupational Health - Occupational Stress Questionnaire    Feeling of Stress : Not at all  Social Connections: Moderately Isolated (11/26/2023)   Social Connection and Isolation Panel [NHANES]    Frequency of Communication with Friends and Family: More than three times a week    Frequency of Social Gatherings with Friends and Family: Twice a week    Attends Religious Services: More than 4 times per year    Active Member of [employer] or Organizations: No    Attends Engineer, structural: Never    Marital Status: Divorced    Tobacco Counseling Counseling given: Not Answered    Clinical Intake:  Pre-visit preparation completed: Yes  Pain : No/denies pain     BMI - recorded: 24.14 Nutritional Status: BMI of 19-24  Normal Nutritional Risks: None Diabetes: No  How often do you need to have someone help you when you read instructions, pamphlets, or other written materials from your doctor or pharmacy?: 1 - Never  Interpreter Needed?: No  Information entered by :: Lanier Ensign, LPN   Activities of Daily Living     11/26/2023   10:47 AM  In your present state of health, do you have any difficulty performing the following activities:  Hearing? 0  Vision? 0  Difficulty concentrating or making  decisions? 0  Walking or climbing stairs? 1  Comment uses a walker don't have any in home  Dressing or bathing? 0  Doing errands, shopping? 0  Preparing Food and eating ? N  Using the Toilet? N  In the past six months, have you accidently leaked urine? N  Do you have problems with loss of bowel control? N  Managing your Medications? N  Managing your Finances? N  Housekeeping or managing your Housekeeping? N    Patient Care Team: Ardith Dark, MD as PCP - General (Family Medicine) Carlye Grippe, MD as Referring Physician (Obstetrics and Gynecology) Tat, Octaviano Batty, DO as Consulting Physician (Neurology) Charna Elizabeth, MD as Consulting Physician (Gastroenterology)  Indicate any recent Medical Services you may have received from other than [facility] providers in the past year (date may be approximate).  Assessment:   This is a routine wellness examination for Harts.  Hearing/Vision screen Hearing Screening - Comments:: Pt denies any hearing issues  Vision Screening - Comments:: Pt follows up with lens crafter for annual eye exams    Goals Addressed             This Visit's Progress    Patient Stated       Work on walking better without walker        Depression Screen     11/26/2023   10:51 AM 02/15/2023    1:06 PM 11/16/2022   10:07 AM 11/03/2021   10:18 AM 03/28/2021    2:42 PM 11/29/2020   10:28 AM 11/01/2020    2:22 PM  PHQ 2/9 Scores  PHQ - 2 Score 0 0 0 0 0 0 0    Fall Risk     11/26/2023   10:50 AM 02/15/2023    1:06 PM 11/16/2022   10:09 AM 11/03/2021   10:20 AM 03/28/2021    2:42 PM  Fall Risk   Falls in the past year? 0 0 1 0 0  Number falls in past yr: 0  1 0 0  Injury with Fall? 0 0 0 0 0  Risk for fall due to : Impaired balance/gait;Impaired mobility Impaired balance/gait;No Fall Risks Impaired balance/gait;Impaired mobility;Impaired vision;History of fall(s) Impaired vision;Impaired balance/gait;Impaired mobility Impaired balance/gait  Follow up  Falls prevention discussed;Falls evaluation completed  Falls prevention discussed Falls prevention discussed     MEDICARE RISK AT HOME:  Medicare Risk at Home Any stairs in or around the home?: Yes If so, are there any without handrails?: No Home free of loose throw rugs in walkways, pet beds, electrical cords, etc?: Yes Adequate lighting in your home to reduce risk of falls?: Yes Life alert?: Yes Use of a cane, walker or w/c?: Yes Grab bars in the bathroom?: Yes Shower chair or bench in shower?: Yes Elevated toilet seat or a handicapped toilet?: Yes  TIMED UP AND GO:  Was the test performed?  No  Cognitive Function: 6CIT completed        11/26/2023   10:52 AM 11/16/2022   10:10 AM 11/03/2021   10:21 AM  6CIT Screen  What Year? 0 points 0 points 0 points  What month? 0 points 0 points 0 points  What time? 0 points 0 points 0 points  Count back from 20 0 points 0 points 0 points  Months in reverse 0 points 0 points 0 points  Repeat phrase 0 points 0 points 4 points  Total Score 0 points 0 points 4 points    Immunizations Immunization History  Administered Date(s) Administered   Moderna SARS-COV2 Booster Vaccination 07/22/2020   Moderna Sars-Covid-2 Vaccination 11/10/2019, 12/16/2019, 07/22/2020, 08/10/2021   PNEUMOCOCCAL CONJUGATE-20 12/08/2021    Screening Tests Health Maintenance  Topic Date Due   Zoster Vaccines- Shingrix (1 of 2) Never done   COVID-19 Vaccine (6 - 2024-25 season) 05/20/2023   DEXA SCAN  10/06/2023   INFLUENZA VACCINE  12/17/2023 (Originally 04/19/2023)   Medicare Annual Wellness (AWV)  11/25/2024   MAMMOGRAM  07/17/2025   Colonoscopy  01/11/2033   Pneumonia Vaccine 15+ Years old  Completed   Hepatitis C Screening  Completed   HPV VACCINES  Aged Out   DTaP/Tdap/Td  Discontinued    Health Maintenance  Health Maintenance Due  Topic Date Due   Zoster Vaccines- Shingrix (1 of 2) Never done   COVID-19 Vaccine (6 - 2024-25  season) 05/20/2023    DEXA SCAN  10/06/2023   Health Maintenance Items Addressed: See Nurse Notes  Additional Screening:  Vision Screening: Recommended annual ophthalmology exams for early detection of glaucoma and other disorders of the eye.  Dental Screening: Recommended annual dental exams for proper oral hygiene  Community Resource Referral / Chronic Care Management: CRR required this visit?  No   CCM required this visit?  No     Plan:     I have personally reviewed and noted the following in the patient's chart:   Medical and social history Use of alcohol, tobacco or illicit drugs  Current medications and supplements including opioid prescriptions. Patient is not currently taking opioid prescriptions. Functional ability and status Nutritional status Physical activity Advanced directives List of other physicians Hospitalizations, surgeries, and ER visits in previous 12 months Vitals Screenings to include cognitive, depression, and falls Referrals and appointments  In addition, I have reviewed and discussed with patient certain preventive protocols, quality metrics, and best practice recommendations. A written personalized care plan for preventive services as well as general preventive health recommendations were provided to patient.     Marzella Schlein, LPN   12/25/8117   After Visit Summary: (MyChart) Due to this being a telephonic visit, the after visit summary with patients personalized plan was offered to patient via MyChart   Notes: Nothing significant to report at this time.

## 2024-03-10 ENCOUNTER — Encounter: Payer: Self-pay | Admitting: Family Medicine

## 2024-03-18 ENCOUNTER — Other Ambulatory Visit: Payer: Self-pay | Admitting: Family Medicine

## 2024-03-18 ENCOUNTER — Ambulatory Visit: Admitting: Sports Medicine

## 2024-03-31 ENCOUNTER — Encounter: Payer: Self-pay | Admitting: Family Medicine

## 2024-03-31 ENCOUNTER — Ambulatory Visit: Admitting: Family Medicine

## 2024-03-31 VITALS — BP 138/91 | Ht 63.0 in | Wt 130.0 lb

## 2024-03-31 DIAGNOSIS — M5416 Radiculopathy, lumbar region: Secondary | ICD-10-CM | POA: Diagnosis not present

## 2024-03-31 DIAGNOSIS — R29898 Other symptoms and signs involving the musculoskeletal system: Secondary | ICD-10-CM

## 2024-03-31 NOTE — Progress Notes (Addendum)
 PCP: Kennyth Worth HERO, MD  Subjective:   HPI: Patient is a 73 y.o. female here for left leg weakness.  Presenting today for evaluation of left leg weakness present for several months.  Denies inciting event or trauma at onset of weakness.  She has a known history of cerebellar ataxia and ambulates with a rollator at baseline.  Most recently she has been using a wheelchair due to concern for falls.  Denies numbness in the left leg.  Endorses mild pain along the shin and the lateral hip. Denies pain along the lumbar spine. Additionally denies bowel/bladder incontinence, fever/chills, night sweats, and unintentional weight loss.  Past Medical History:  Diagnosis Date   Allergy    Arthritis    Cerebellar ataxia (HCC)    Reported by daughter   Osteoporosis     Current Outpatient Medications on File Prior to Visit  Medication Sig Dispense Refill   alendronate  (FOSAMAX ) 70 MG tablet TAKE 1 TABLET (70 MG TOTAL) BY MOUTH EVERY 7 DAYS WITH FULL GLASS WATER ON EMPTY STOMACH 12 tablet 4   B Complex Vitamins (B COMPLEX-B12 PO) Take 1 tablet by mouth daily.     calcium  citrate-vitamin D (CITRACAL+D) 315-200 MG-UNIT tablet Take 1 tablet by mouth daily.     ELDERBERRY PO Take by mouth.     Multiple Vitamin (MULTIVITAMIN) tablet Take 1 tablet by mouth daily.     rosuvastatin  (CRESTOR ) 5 MG tablet TAKE 1 TABLET (5 MG TOTAL) BY MOUTH DAILY. 90 tablet 0   Turmeric (QC TUMERIC COMPLEX PO) Tumeric     No current facility-administered medications on file prior to visit.    Past Surgical History:  Procedure Laterality Date   ABDOMINAL HYSTERECTOMY Bilateral 1999,2000    Allergies  Allergen Reactions   Demerol [Meperidine]     BP (!) 138/91   Ht 5' 3 (1.6 m)   Wt 130 lb (59 kg)   BMI 23.03 kg/m       No data to display              No data to display              Objective:  Physical Exam:  Gen: NAD, comfortable in exam room Unable to stand independently, leans against  wall  Left Leg No gross deformity on inspection No TTP Sensation grossly intact Reduced strength (3/5) with hip flexion, knee extension/flexion, and dorsiflexion/plantarflexion of the foot 3+ patellar reflex   Assessment & Plan:  1.  Left leg weakness -evaluated today for left leg weakness present for several months.  She has fallen as result of weakness and is largely wheelchair dependent now.  Significant left lower extremity weakness noted on exam today.  Sensation is grossly intact.  There is no specific dermatomal distribution of weakness.  I suspect pain is due to lumbar radiculopathy.  Treatment options reviewed.  Recommend proceeding with MRI of the lumbar spine.  Open MRI requested at patient's preference.  We discussed as needed use of NSAIDs for pain relief.  Will also coordinate fitting for a Bledsoe brace due to hyperextension of the knee when standing. Use rollator as comfortable. Plan for follow-up pending completion of MRI. Consider referral for PT pending MRI results.

## 2024-03-31 NOTE — Patient Instructions (Addendum)
 [facility] Rep: Aleck Shropshire : [phone] She will contact you (or her partner Bernardino) to set up an appt to be fitted for the knee brace. If you don't hear from either of them by the end of the week, please let us  know.  We will go ahead with an MRI of your lumbar spine. Use the rollator and sit down if needed. We will consider physical therapy, other options depending on the MRI results. Aleve 1-2 tabs twice a day with food for pain and inflammation - don't take ibuprofen while on this. Schedule a no-charge visit to review MRI results and next steps when these come through.

## 2024-04-01 ENCOUNTER — Encounter: Payer: Self-pay | Admitting: Family Medicine

## 2024-04-14 ENCOUNTER — Ambulatory Visit
Admission: RE | Admit: 2024-04-14 | Discharge: 2024-04-14 | Disposition: A | Discharge status: Home / Self Care | Source: Ambulatory Visit | Attending: Family Medicine | Admitting: Family Medicine

## 2024-04-14 DIAGNOSIS — M5416 Radiculopathy, lumbar region: Secondary | ICD-10-CM

## 2024-04-14 DIAGNOSIS — M4317 Spondylolisthesis, lumbosacral region: Secondary | ICD-10-CM | POA: Diagnosis not present

## 2024-04-14 DIAGNOSIS — M48061 Spinal stenosis, lumbar region without neurogenic claudication: Secondary | ICD-10-CM | POA: Diagnosis not present

## 2024-04-15 ENCOUNTER — Ambulatory Visit: Admitting: Sports Medicine

## 2024-04-21 ENCOUNTER — Encounter: Payer: Self-pay | Admitting: Family Medicine

## 2024-04-22 ENCOUNTER — Other Ambulatory Visit: Payer: Self-pay | Admitting: Family Medicine

## 2024-04-30 ENCOUNTER — Ambulatory Visit: Payer: Self-pay | Admitting: Family Medicine

## 2024-04-30 MED ORDER — PREDNISONE 10 MG PO TABS: ORAL_TABLET | ORAL | 0 refills | Status: AC

## 2024-05-14 ENCOUNTER — Other Ambulatory Visit: Payer: Self-pay | Admitting: Family Medicine

## 2024-05-18 ENCOUNTER — Other Ambulatory Visit: Payer: Self-pay | Admitting: Family Medicine

## 2024-05-28 ENCOUNTER — Telehealth: Admitting: Family Medicine

## 2024-05-28 DIAGNOSIS — M810 Age-related osteoporosis without current pathological fracture: Secondary | ICD-10-CM | POA: Diagnosis not present

## 2024-05-28 DIAGNOSIS — E782 Mixed hyperlipidemia: Secondary | ICD-10-CM

## 2024-05-28 MED ORDER — ROSUVASTATIN CALCIUM 5 MG PO TABS: 5.0000 mg | ORAL_TABLET | Freq: Every day | ORAL | 3 refills | Status: AC

## 2024-05-28 MED ORDER — ALENDRONATE SODIUM 70 MG PO TABS: ORAL_TABLET | ORAL | 5 refills | Status: AC

## 2024-05-28 NOTE — Progress Notes (Signed)
   Priscilla Washington is a 73 y.o. female who presents today for a virtual office visit.  Assessment/Plan:  Chronic Problems Addressed Today: Dyslipidemia On Crestor  5 mg daily.  Tolerating well.  Will refill today.  Instructed patient to come in soon for office visit for labs.  Osteoporosis (T score -3 DEXA 09/2021) Doing well with Fosamax  70 mg weekly.  Due for repeat DEXA.  Will order this today.  Tolerating medication well without significant side effects.    Subjective:  HPI:  See Assessment / plan for status of chronic conditions.  Patient here today for annual follow-up.  Last saw her over a year ago.  She needs refill medications today.  No acute concerns.  She has been tolerating both her Fosamax  and Crestor  well.  No significant side effects.        Objective/Observations  Physical Exam: Gen: NAD, resting comfortably Pulm: Normal work of breathing Neuro: Grossly normal, moves all extremities Psych: Normal affect and thought content  Virtual Visit via Video   I connected with Priscilla Washington on 05/28/24 at  9:00 AM EDT by a video enabled telemedicine application and verified that I am speaking with the correct person using two identifiers. The limitations of evaluation and management by telemedicine and the availability of in person appointments were discussed. The patient expressed understanding and agreed to proceed.   Patient location: Home Provider location: [place] [employer] Persons participating in the virtual visit: Myself and Patient     Worth HERO. Kennyth, MD 05/28/2024 9:00 AM

## 2024-05-28 NOTE — Assessment & Plan Note (Signed)
 Doing well with Fosamax  70 mg weekly.  Due for repeat DEXA.  Will order this today.  Tolerating medication well without significant side effects.

## 2024-05-28 NOTE — Assessment & Plan Note (Signed)
 On Crestor  5 mg daily.  Tolerating well.  Will refill today.  Instructed patient to come in soon for office visit for labs.

## 2024-06-26 ENCOUNTER — Other Ambulatory Visit (HOSPITAL_BASED_OUTPATIENT_CLINIC_OR_DEPARTMENT_OTHER): Payer: Self-pay | Admitting: Family Medicine

## 2024-06-26 DIAGNOSIS — Z1231 Encounter for screening mammogram for malignant neoplasm of breast: Secondary | ICD-10-CM

## 2024-08-04 ENCOUNTER — Ambulatory Visit (HOSPITAL_BASED_OUTPATIENT_CLINIC_OR_DEPARTMENT_OTHER)
Admission: RE | Admit: 2024-08-04 | Discharge: 2024-08-04 | Disposition: A | Discharge status: Home / Self Care | Source: Ambulatory Visit | Attending: Family Medicine | Admitting: Family Medicine

## 2024-08-04 ENCOUNTER — Encounter (HOSPITAL_BASED_OUTPATIENT_CLINIC_OR_DEPARTMENT_OTHER): Payer: Self-pay

## 2024-08-04 DIAGNOSIS — Z1231 Encounter for screening mammogram for malignant neoplasm of breast: Secondary | ICD-10-CM | POA: Insufficient documentation

## 2024-08-04 DIAGNOSIS — M810 Age-related osteoporosis without current pathological fracture: Secondary | ICD-10-CM | POA: Insufficient documentation

## 2024-08-04 DIAGNOSIS — Z780 Asymptomatic menopausal state: Secondary | ICD-10-CM | POA: Diagnosis not present

## 2024-08-05 ENCOUNTER — Ambulatory Visit: Payer: Self-pay | Admitting: Family Medicine

## 2024-08-05 NOTE — Progress Notes (Signed)
 Her DEXA scan shows a improvement in her T-scores.  She is still in the osteoporotic range however her bones are more dense than the last time that we checked.  We can continue Fosamax  and recheck again in 2 years.

## 2024-09-26 ENCOUNTER — Encounter: Admitting: Family Medicine

## 2024-10-16 ENCOUNTER — Encounter: Admitting: Family Medicine

## 2024-11-27 ENCOUNTER — Ambulatory Visit
